# Patient Record
Sex: Female | Born: 1975 | Hispanic: No | Marital: Married | Smoking: Never smoker
Health system: Southern US, Community
[De-identification: ages and names within clinical notes are randomized; demographics above are authoritative.]

## PROBLEM LIST (undated history)

## (undated) DIAGNOSIS — G510 Bell's palsy: Secondary | ICD-10-CM

## (undated) DIAGNOSIS — O99350 Diseases of the nervous system complicating pregnancy, unspecified trimester: Secondary | ICD-10-CM

## (undated) DIAGNOSIS — IMO0002 Reserved for concepts with insufficient information to code with codable children: Secondary | ICD-10-CM

## (undated) DIAGNOSIS — F99 Mental disorder, not otherwise specified: Secondary | ICD-10-CM

## (undated) DIAGNOSIS — F410 Panic disorder [episodic paroxysmal anxiety] without agoraphobia: Secondary | ICD-10-CM

## (undated) DIAGNOSIS — Z789 Other specified health status: Secondary | ICD-10-CM

## (undated) DIAGNOSIS — K219 Gastro-esophageal reflux disease without esophagitis: Secondary | ICD-10-CM

## (undated) HISTORY — DX: Gastro-esophageal reflux disease without esophagitis: K21.9

## (undated) HISTORY — DX: Reserved for concepts with insufficient information to code with codable children: IMO0002

## (undated) HISTORY — PX: NO PAST SURGERIES: SHX2092

---

## 2004-07-10 LAB — CONVERTED CEMR LAB: Pap Smear: NORMAL

## 2007-05-22 ENCOUNTER — Ambulatory Visit: Payer: Self-pay | Admitting: Family Medicine

## 2007-05-22 DIAGNOSIS — K219 Gastro-esophageal reflux disease without esophagitis: Secondary | ICD-10-CM

## 2007-09-06 ENCOUNTER — Ambulatory Visit: Payer: Self-pay | Admitting: Family Medicine

## 2007-12-11 ENCOUNTER — Telehealth: Payer: Self-pay | Admitting: Family Medicine

## 2007-12-20 ENCOUNTER — Ambulatory Visit: Payer: Self-pay | Admitting: Family Medicine

## 2007-12-20 DIAGNOSIS — A048 Other specified bacterial intestinal infections: Secondary | ICD-10-CM | POA: Insufficient documentation

## 2007-12-20 DIAGNOSIS — Z8711 Personal history of peptic ulcer disease: Secondary | ICD-10-CM | POA: Insufficient documentation

## 2007-12-23 ENCOUNTER — Telehealth: Payer: Self-pay | Admitting: Family Medicine

## 2007-12-24 LAB — CONVERTED CEMR LAB
ALT: 14 units/L (ref 0–35)
AST: 20 units/L (ref 0–37)
Albumin: 4.3 g/dL (ref 3.5–5.2)
Alkaline Phosphatase: 49 units/L (ref 39–117)
BUN: 12 mg/dL (ref 6–23)
Basophils Absolute: 0 10*3/uL (ref 0.0–0.1)
Basophils Relative: 0 % (ref 0–1)
Bilirubin, Direct: 0.1 mg/dL (ref 0.0–0.3)
CO2: 23 meq/L (ref 19–32)
Calcium: 8.7 mg/dL (ref 8.4–10.5)
Chloride: 106 meq/L (ref 96–112)
Creatinine, Ser: 0.64 mg/dL (ref 0.40–1.20)
Eosinophils Absolute: 0.1 10*3/uL (ref 0.0–0.7)
Eosinophils Relative: 1 % (ref 0–5)
Glucose, Bld: 87 mg/dL (ref 70–99)
HCT: 35.8 % — ABNORMAL LOW (ref 36.0–46.0)
Hemoglobin: 12 g/dL (ref 12.0–15.0)
Indirect Bilirubin: 0.5 mg/dL (ref 0.0–0.9)
Lipase: 17 units/L (ref 0–75)
Lymphocytes Relative: 28 % (ref 12–46)
Lymphs Abs: 2.6 10*3/uL (ref 0.7–4.0)
MCHC: 33.5 g/dL (ref 30.0–36.0)
MCV: 76.5 fL — ABNORMAL LOW (ref 78.0–100.0)
Monocytes Absolute: 0.7 10*3/uL (ref 0.1–1.0)
Monocytes Relative: 8 % (ref 3–12)
Neutro Abs: 6 10*3/uL (ref 1.7–7.7)
Neutrophils Relative %: 64 % (ref 43–77)
Platelets: 287 10*3/uL (ref 150–400)
Potassium: 3.7 meq/L (ref 3.5–5.3)
RBC: 4.68 M/uL (ref 3.87–5.11)
RDW: 13.1 % (ref 11.5–15.5)
Sodium: 140 meq/L (ref 135–145)
Total Bilirubin: 0.6 mg/dL (ref 0.3–1.2)
Total Protein: 7 g/dL (ref 6.0–8.3)
WBC: 9.4 10*3/uL (ref 4.0–10.5)

## 2010-01-18 ENCOUNTER — Ambulatory Visit: Payer: Self-pay | Admitting: Family Medicine

## 2010-01-18 DIAGNOSIS — R51 Headache: Secondary | ICD-10-CM | POA: Insufficient documentation

## 2010-01-18 DIAGNOSIS — R519 Headache, unspecified: Secondary | ICD-10-CM | POA: Insufficient documentation

## 2010-05-10 LAB — CONVERTED CEMR LAB: Pap Smear: NORMAL

## 2010-07-20 ENCOUNTER — Telehealth (INDEPENDENT_AMBULATORY_CARE_PROVIDER_SITE_OTHER): Payer: Self-pay | Admitting: *Deleted

## 2010-07-22 ENCOUNTER — Other Ambulatory Visit: Payer: Self-pay | Admitting: Family Medicine

## 2010-07-22 ENCOUNTER — Ambulatory Visit
Admission: RE | Admit: 2010-07-22 | Discharge: 2010-07-22 | Payer: Self-pay | Source: Home / Self Care | Attending: Family Medicine | Admitting: Family Medicine

## 2010-07-22 LAB — HEPATIC FUNCTION PANEL
ALT: 16 U/L (ref 0–35)
AST: 21 U/L (ref 0–37)
Albumin: 4.2 g/dL (ref 3.5–5.2)
Alkaline Phosphatase: 45 U/L (ref 39–117)
Bilirubin, Direct: 0.1 mg/dL (ref 0.0–0.3)
Total Bilirubin: 0.8 mg/dL (ref 0.3–1.2)
Total Protein: 7.2 g/dL (ref 6.0–8.3)

## 2010-07-22 LAB — LDL CHOLESTEROL, DIRECT: Direct LDL: 139.5 mg/dL

## 2010-07-22 LAB — LIPID PANEL
Cholesterol: 207 mg/dL — ABNORMAL HIGH (ref 0–200)
HDL: 62.4 mg/dL (ref >39.00)
Total CHOL/HDL Ratio: 3
Triglycerides: 33 mg/dL (ref 0.0–149.0)
VLDL: 6.6 mg/dL (ref 0.0–40.0)

## 2010-07-22 LAB — BASIC METABOLIC PANEL
BUN: 13 mg/dL (ref 6–23)
CO2: 28 mEq/L (ref 19–32)
Calcium: 9 mg/dL (ref 8.4–10.5)
Chloride: 102 mEq/L (ref 96–112)
Creatinine, Ser: 0.6 mg/dL (ref 0.4–1.2)
GFR: 129.86 mL/min (ref >60.00)
Glucose, Bld: 91 mg/dL (ref 70–99)
Potassium: 3.7 mEq/L (ref 3.5–5.1)
Sodium: 136 mEq/L (ref 135–145)

## 2010-07-29 ENCOUNTER — Ambulatory Visit
Admission: RE | Admit: 2010-07-29 | Discharge: 2010-07-29 | Payer: Self-pay | Source: Home / Self Care | Attending: Family Medicine | Admitting: Family Medicine

## 2010-08-09 NOTE — Assessment & Plan Note (Signed)
Summary: HA X 1 WK/CLE   Vital Signs:  Patient profile:   35 year old female Height:      60.75 inches Weight:      109.4 pounds BMI:     20.92 Temp:     98.5 degrees F oral Pulse rate:   92 / minute Pulse rhythm:   regular BP sitting:   90 / 60  (left arm) Cuff size:   regular  Vitals Entered By: Benny Lennert CMA Duncan Dull) (January 18, 2010 3:31 PM)   History of Present Illness: Chief complaint headache for 1 week  In last week has had a headache..this is more severe than intermittant headhcaes in past. Associated nausea, some dizzy, irritated with light and sound. B temples and posterior neck and pain in upper shoulders. Describes as squeezing pain.  8/10 HAs been under a lot of stress lately. No numbness, no weakness Some fatigue. no vision changes... did recently have new eye glasses in past 6 months.  Using computer all the time.  Feels like heat triggered.   Has been using advil (400 mg at a time)...helps some but then returns.  Hx of migraine in past.   Interested in getting pregnant.Marland Kitchenafter next period.   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 1 Tab By Mouth Q 6 Hours As Needed Headache  Allergies (verified): No Known Drug Allergies  Past History:  Past medical, surgical, family and social histories (including risk factors) reviewed, and no changes noted (except as noted below).  Past Medical History: Reviewed history from 05/22/2007 and no changes required. GERD Peptic ulcer disease  Past Surgical History: Reviewed history from 05/22/2007 and no changes required. Endoscopy 2005: PUD Denies surgical history  Family History: Reviewed history from  05/22/2007 and no changes required. father age 41 healthy mother age 65 healthy 1 sister healthy gtreat uncle leukemia  Social History: Reviewed history from 05/22/2007 and no changes required. Occupation: Magazine features editor Married no abuse Never Smoked Alcohol use-no Drug use-no Regular exercise-yes, 2 x weekrunning, lift weight Diet: fruits and veggies, water  Review of Systems General:  Denies fatigue and fever. CV:  Denies chest pain or discomfort. Resp:  Denies shortness of breath. GI:  Denies abdominal pain. GU:  Denies dysuria.  Physical Exam  General:  Well-developed,well-nourished,in no acute distress; alert,appropriate and cooperative throughout examination Head:  no maxillary sinusttp  Eyes:  No corneal or conjunctival inflammation noted. EOMI. Perrla. Funduscopic exam benign, without hemorrhages, exudates or papilledema. Vision grossly normal. Ears:  External ear exam shows no significant lesions or deformities.  Otoscopic examination reveals clear canals, tympanic membranes are intact bilaterally without bulging, retraction, inflammation or discharge. Hearing is grossly normal bilaterally. Nose:  External nasal examination shows no deformity or inflammation. Nasal mucosa are pink and moist without lesions or exudates. Mouth:  Oral mucosa and oropharynx without lesions or exudates.  Teeth in good repair. Neck:  no carotid bruit or thyromegaly no cervical or supraclavicular lymphadenopathy  Lungs:  Normal respiratory effort, chest expands symmetrically. Lungs are clear to auscultation, no crackles or wheezes. Heart:  Normal rate and regular rhythm. S1 and S2 normal without gallop, murmur, click, rub or other extra sounds. Neurologic:  No cranial nerve deficits noted. Station and gait  are normal. DTRs are symmetrical throughout. Sensory, motor and coordinative functions appear intact.   Impression & Recommendations:  Problem # 1:  HEADACHE (ICD-784.0) Tension headhace vs  migriane. Gave info on headache diary anfd migraine trigger.  Work on eating regularly, pushing fluids, sleeping and exercising adequately. Refused torodol injection.  Fiorecet for headache, ibuprofen for moderate headhache.  Ice massage on neck.  Follow up if not improving.  Her updated medication list for this problem includes:    Fioricet 50-325-40 Mg Tabs (Butalbital-apap-caffeine) .Marland Kitchen... 1 tab by mouth q 6 hours as needed headache  Complete Medication List: 1)  Fioricet 50-325-40 Mg Tabs (Butalbital-apap-caffeine) .Marland Kitchen.. 1 tab by mouth q 6 hours as needed headache  Patient Instructions: 1)  USe fioricet for severe headaches. MAy use ibuprofen 800 mg every 3 hours as needed  for milder headhaces. 2)  Eat regularly. increase water. 3)   Decrease caffeine. 4)   Keep headache diary...look for any possible migraine trigger. 5)  Make a follow up appt if headaches are not improving. 6)  Schedule CPX in next few weeks. 7)  Use tylenol for headhaces whe trying to get pregnant or pregnant. 8)  Start prenatal vitamin with folic acid.Marland Kitchen 9)  Book "What to Expect When You Are Expecting" Prescriptions: FIORICET 50-325-40 MG TABS (BUTALBITAL-APAP-CAFFEINE) 1 tab by mouth q 6 hours as needed headache  #20 x 0   Entered and Authorized by:   Kerby Nora MD   Signed by:   Kerby Nora MD on 01/18/2010   Method used:   Print then Give to Patient   RxID:   [id number]   Current Allergies (reviewed today): No known allergies

## 2010-08-11 NOTE — Assessment & Plan Note (Signed)
Summary: CPX  CYD   Vital Signs:  Patient profile:   35 year old female Height:      60.75 inches Weight:      112.50 pounds BMI:     21.51 Temp:     97.8 degrees F oral Pulse rate:   88 / minute Pulse rhythm:   regular BP sitting:   100 / 70  (left arm) Cuff size:   regular  Vitals Entered By: Benny Lennert CMA Duncan Dull) (July 29, 2010 3:29 PM)  History of Present Illness: Chief complaint: Follow up   The patient is here for annual wellness exam and preventative care.    Sees GYN for CPX/ PAP and breast exam.   Tension headache (Vise around head and pain in neck.. mild, occuring every 2 days.. she feels it is due to stress.  Headaches improved when in [place] for 5 weeks.  Has stopped fioricet due to planning on getting pregnant.  Stretching head and neck. Skipping meals a lot.    Preventive Screening-Counseling & Management  Caffeine-Diet-Exercise     Diet Comments: healthy foods.     Does Patient Exercise: yes     Times/week: 3-4  Problems Prior to Update: 1)  Screening For Lipoid Disorders  (ICD-V77.91) 2)  Headache  (ICD-784.0) 3)  Hx of Peptic Ulcer Disease  (ICD-533.90) 4)  Hx of Helicobacter Pylori Gastritis  (ICD-041.86) 5)  Gerd  (ICD-530.81)  Current Medications (verified): 1)  Th Prenatal Vitamins 28-0.8 Mg Tabs (Prenatal Vit-Fe Fumarate-Fa) .... Take One Tablet Daily  Allergies (verified): No Known Drug Allergies  Past History:  Past medical, surgical, family and social histories (including risk factors) reviewed, and no changes noted (except as noted below).  Past Medical History: Reviewed history from 05/22/2007 and no changes required. GERD Peptic ulcer disease  Past Surgical History: Reviewed history from 05/22/2007 and no changes required. Endoscopy 2005: PUD Denies surgical history  Family History: Reviewed history from 05/22/2007 and no changes required. father age 50 healthy mother age 20 healthy 1 sister healthy gtreat  uncle leukemia  Social History: Reviewed history from 05/22/2007 and no changes required. Occupation: Magazine features editor Married no abuse Never Smoked Alcohol use-no Drug use-no Regular exercise-yes, 2 x weekrunning, lift weight Diet: fruits and veggies, water  Review of Systems General:  Denies fatigue and fever. CV:  Denies chest pain or discomfort. Resp:  Denies shortness of breath. GI:  Denies abdominal pain. GU:  Denies dysuria.  Physical Exam  General:  Well-developed,well-nourished,in no acute distress; alert,appropriate and cooperative throughout examination Head:  Normocephalic and atraumatic without obvious abnormalities. No apparent alopecia or balding. Eyes:  No corneal or conjunctival inflammation noted. EOMI. Perrla. Funduscopic exam benign, without hemorrhages, exudates or papilledema. Vision grossly normal. Ears:  External ear exam shows no significant lesions or deformities.  Otoscopic examination reveals clear canals, tympanic membranes are intact bilaterally without bulging, retraction, inflammation or discharge. Hearing is grossly normal bilaterally. Nose:  External nasal examination shows no deformity or inflammation. Nasal mucosa are pink and moist without lesions or exudates. Mouth:  Oral mucosa and oropharynx without lesions or exudates.  Teeth in good repair. Neck:  no carotid bruit or thyromegaly no cervical or supraclavicular lymphadenopathy  Breasts:  per GYN  Lungs:  Normal respiratory effort, chest expands symmetrically. Lungs are clear to auscultation, no crackles or wheezes. Heart:  Normal rate and regular rhythm. S1 and S2 normal without gallop, murmur, click, rub or other extra sounds. Abdomen:  Bowel sounds positive,abdomen soft and non-tender  without masses, organomegaly or hernias noted. Genitalia:  per GYN  Msk:  No deformity or scoliosis noted of thoracic or lumbar spine.   Pulses:  R and L carotid,radial,femoral,dorsalis pedis and posterior tibial  pulses are full and equal bilaterally Extremities:  No clubbing, cyanosis, edema, or deformity noted with normal full range of motion of all joints.   Neurologic:  No cranial nerve deficits noted. Station and gait are normal. Plantar reflexes are down-going bilaterally. DTRs are symmetrical throughout. Sensory, motor and coordinative functions appear intact. Skin:  Intact without suspicious lesions or rashes Psych:  Cognition and judgment appear intact. Alert and cooperative with normal attention span and concentration. No apparent delusions, illusions, hallucinations   Impression & Recommendations:  Problem # 1:  GERD (ICD-530.81) Assessment Improved Well controlled off medicaiton.   Problem # 2:  HEADACHE (ICD-784.0) Assessment: Improved Tension headhaces. Avoid sipping meals, increase exercsie, stress reduction. Head and neck strethcing. Nml neuro exam.  The following medications were removed from the medication list:    Fioricet 50-325-40 Mg Tabs (Butalbital-apap-caffeine) .Marland Kitchen... 1 tab by mouth q 6 hours as needed headache  Problem # 3:  Preventive Health Care (ICD-V70.0) Assessment: Comment Only The patient's preventative maintenance and recommended screening tests for an annual wellness exam were reviewed in full today. Brought up to date unless services declined.  Counselled on the importance of diet, exercise, and its role in overall health and mortality. The patient's FH and SH was reviewed, including their home life, tobacco status, and drug and alcohol status.    Sees GYN.   Complete Medication List: 1)  Th Prenatal Vitamins 28-0.8 Mg Tabs (Prenatal vit-fe fumarate-fa) .... Take one tablet daily  Patient Instructions: 1)  Work on three meals a day... try fat free yogurt or other healthy foods for breakfast. 2)  Continue regular exercise. 3)  Please schedule a follow-up appointment in 1 year.    Orders Added: 1)  Est. Patient Level IV [16109]    Current Allergies  (reviewed today): No known allergies   Flu Vaccine Result Date:  05/10/2009 Flu Vaccine Result:  given Flu Vaccine Next Due:  1 yr Last PAP:  Normal (07/10/2004 8:40:57 AM) PAP Result Date:  05/10/2010 PAP Result:  normal PAP Next Due:  1 yr

## 2010-08-11 NOTE — Progress Notes (Signed)
----   Converted from flag ---- ---- 07/19/2010 9:53 AM, Kerby Nora MD wrote: Offer STD testing if interested.  CMET, lipids Dx v77.91  ---- 07/18/2010 11:38 AM, Liane Comber CMA (AAMA) wrote: Lab orders please! Good Morning! This pt is scheduled for cpx labs Friday, which labs to draw and dx codes to use? Thanks Tasha ------------------------------

## 2010-10-17 LAB — HIV ANTIBODY (ROUTINE TESTING W REFLEX): HIV: NONREACTIVE

## 2010-10-17 LAB — ABO/RH: RH Type: POSITIVE

## 2011-03-11 ENCOUNTER — Inpatient Hospital Stay (HOSPITAL_COMMUNITY): Admission: AD | Admit: 2011-03-11 | Payer: Self-pay | Source: Ambulatory Visit | Admitting: Obstetrics & Gynecology

## 2011-05-12 ENCOUNTER — Inpatient Hospital Stay (HOSPITAL_COMMUNITY): Payer: BC Managed Care – PPO

## 2011-05-12 ENCOUNTER — Encounter (HOSPITAL_COMMUNITY): Payer: Self-pay | Admitting: *Deleted

## 2011-05-12 ENCOUNTER — Inpatient Hospital Stay (HOSPITAL_COMMUNITY)
Admission: AD | Admit: 2011-05-12 | Discharge: 2011-05-12 | Disposition: A | Discharge status: Home / Self Care | Payer: BC Managed Care – PPO | Source: Ambulatory Visit | Attending: Obstetrics | Admitting: Obstetrics

## 2011-05-12 DIAGNOSIS — O368190 Decreased fetal movements, unspecified trimester, not applicable or unspecified: Secondary | ICD-10-CM | POA: Insufficient documentation

## 2011-05-12 DIAGNOSIS — G510 Bell's palsy: Secondary | ICD-10-CM | POA: Insufficient documentation

## 2011-05-12 DIAGNOSIS — O99891 Other specified diseases and conditions complicating pregnancy: Secondary | ICD-10-CM | POA: Insufficient documentation

## 2011-05-12 LAB — GLUCOSE, CAPILLARY: Glucose-Capillary: 101 mg/dL — ABNORMAL HIGH (ref 70–99)

## 2011-05-12 NOTE — Progress Notes (Signed)
Dr Ernestina Penna states she is on the way in to see the pt

## 2011-05-12 NOTE — Progress Notes (Signed)
DENIES PAIN.  DENIES HSV AND MRSA.  FELT BABY MOVE- YESTERDAY- Thursday AM- FEELS NOTHING NOW- FHR- 158.  IS TAKING MED FOR BELL PALSY-  FACE IS DRAWN - WENT TO [facility]

## 2011-05-12 NOTE — H&P (Signed)
CC: decreased FM  HPI: 35 yo G1 at 22 + wks who presents for not feeling the baby move since 11 am yest. Pt notes not feeling FM since she started taking Valtrex ant prednisone (60mg  daily) yesterday. Pt went to [facility] on Mon due to concerns of "stroke sx"Pt was having new onset facial paralysis and was dx w/ Bells Palsy. Pt saw Dr. Seymour Bars yest, discussed meds and was started them yest. Pt does not want to cont meds.  PMH: none PSH: none Meds: PNV, prednisone, Valtrex All: NKDA  PE:   Filed Vitals:   05/12/11 0325 05/12/11 0531  BP: 120/81 125/70  Pulse: 108 106  Temp: 98.1 F (36.7 C)   TempSrc: Oral   Resp: 20 20  Height: 5' (1.524 m)   Weight: 72.179 kg (159 lb 2 oz)     Gen: lying in bed, no distress, facial paralysis on L Neuro:unable to feel lower L side of face, unable to close L eye, blurred speech, unable to move L side of mouth ABd: gravid, NT GU: per RN cvx closed LE: no edema  Toco: ctx/ irritibitlity q 5-8 FH: 130's, + accels, no decels.  U/s: grossly nl AFI, largest pocket >4cm, BPP 8/8  A/P: 38 wks IUP w/ Bellsy plasy and decreased fetal movement in the setting of reacitve testing - FM. Reasurred pt that baby is doing well and the reactive testign today gives [place] good reasurrance for the next 3 days. If, in that time, not feeling the baby (kick counts reviewed) will need another eval by NST. If new sx (bleeding, abd pain) develop and not feeling FM, will also need another eval - Bells Palsy. Pt would like to stop meds. OK to stop as not clear they will help resolve the Bells and if this is causing pt to feel decreased FM we would rather pt be reasurred about the baby. Will consider meds PP. Importance of proper eye care reviewed. - Reactive fetal testing - f/u in office next wk - Not in labor  Felicia Mccoy A. 05/12/2011 6:23 AM

## 2011-05-13 ENCOUNTER — Encounter (HOSPITAL_COMMUNITY)
Admission: AD | Disposition: A | Discharge status: Home / Self Care | Payer: Self-pay | Source: Ambulatory Visit | Attending: Obstetrics & Gynecology

## 2011-05-13 ENCOUNTER — Other Ambulatory Visit: Payer: Self-pay | Admitting: Obstetrics & Gynecology

## 2011-05-13 ENCOUNTER — Inpatient Hospital Stay (HOSPITAL_COMMUNITY): Payer: BC Managed Care – PPO | Admitting: Anesthesiology

## 2011-05-13 ENCOUNTER — Inpatient Hospital Stay (HOSPITAL_COMMUNITY)
Admission: AD | Admit: 2011-05-13 | Discharge: 2011-05-16 | DRG: 371 | Disposition: A | Discharge status: Home / Self Care | Payer: BC Managed Care – PPO | Source: Ambulatory Visit | Attending: Obstetrics & Gynecology | Admitting: Obstetrics & Gynecology

## 2011-05-13 ENCOUNTER — Encounter (HOSPITAL_COMMUNITY): Payer: Self-pay | Admitting: *Deleted

## 2011-05-13 ENCOUNTER — Encounter (HOSPITAL_COMMUNITY): Payer: Self-pay | Admitting: Anesthesiology

## 2011-05-13 DIAGNOSIS — R51 Headache: Secondary | ICD-10-CM

## 2011-05-13 DIAGNOSIS — K219 Gastro-esophageal reflux disease without esophagitis: Secondary | ICD-10-CM

## 2011-05-13 DIAGNOSIS — O99892 Other specified diseases and conditions complicating childbirth: Secondary | ICD-10-CM | POA: Diagnosis present

## 2011-05-13 DIAGNOSIS — IMO0002 Reserved for concepts with insufficient information to code with codable children: Secondary | ICD-10-CM

## 2011-05-13 DIAGNOSIS — A048 Other specified bacterial intestinal infections: Secondary | ICD-10-CM

## 2011-05-13 DIAGNOSIS — G510 Bell's palsy: Secondary | ICD-10-CM | POA: Diagnosis present

## 2011-05-13 DIAGNOSIS — K279 Peptic ulcer, site unspecified, unspecified as acute or chronic, without hemorrhage or perforation: Secondary | ICD-10-CM

## 2011-05-13 DIAGNOSIS — O411090 Infection of amniotic sac and membranes, unspecified, unspecified trimester, not applicable or unspecified: Secondary | ICD-10-CM | POA: Diagnosis present

## 2011-05-13 HISTORY — DX: Diseases of the nervous system complicating pregnancy, unspecified trimester: O99.350

## 2011-05-13 HISTORY — DX: Panic disorder (episodic paroxysmal anxiety): F41.0

## 2011-05-13 HISTORY — DX: Other specified health status: Z78.9

## 2011-05-13 HISTORY — DX: Bell's palsy: G51.0

## 2011-05-13 HISTORY — DX: Mental disorder, not otherwise specified: F99

## 2011-05-13 LAB — CBC
HCT: 41.2 % (ref 36.0–46.0)
Hemoglobin: 11.6 g/dL — ABNORMAL LOW (ref 12.0–15.0)
Platelets: 284 10*3/uL (ref 150–400)
Platelets: 245 10*3/uL (ref 150–400)
RBC: 4.31 MIL/uL (ref 3.87–5.11)
RDW: 13.8 % (ref 11.5–15.5)
WBC: 14.7 10*3/uL — ABNORMAL HIGH (ref 4.0–10.5)
WBC: 19.6 10*3/uL — ABNORMAL HIGH (ref 4.0–10.5)

## 2011-05-13 LAB — POCT FERN TEST: Fern Test: POSITIVE

## 2011-05-13 SURGERY — Surgical Case
Anesthesia: Regional

## 2011-05-13 MED ORDER — EPHEDRINE 5 MG/ML INJ
10.0000 mg | INTRAVENOUS | Status: DC | PRN
  Filled 2011-05-13 (×2): qty 4

## 2011-05-13 MED ORDER — OXYTOCIN 20 UNITS IN LACTATED RINGERS INFUSION - SIMPLE
125.0000 mL/h | Freq: Once | INTRAVENOUS | Status: DC
  Filled 2011-05-13: qty 1000

## 2011-05-13 MED ORDER — LACTATED RINGERS IV SOLN
500.0000 mL | Freq: Once | INTRAVENOUS | Status: AC
  Administered 2011-05-13: 500 mL via INTRAVENOUS

## 2011-05-13 MED ORDER — DIPHENHYDRAMINE HCL 50 MG/ML IJ SOLN: 12.5000 mg | INTRAMUSCULAR | Status: DC | PRN

## 2011-05-13 MED ORDER — CITRIC ACID-SODIUM CITRATE 334-500 MG/5ML PO SOLN
30.0000 mL | ORAL | Status: DC | PRN
  Administered 2011-05-13 (×2): 30 mL via ORAL
  Filled 2011-05-13 (×2): qty 15

## 2011-05-13 MED ORDER — IBUPROFEN 600 MG PO TABS: 600.0000 mg | ORAL_TABLET | Freq: Four times a day (QID) | ORAL | Status: DC | PRN

## 2011-05-13 MED ORDER — FENTANYL 2.5 MCG/ML BUPIVACAINE 1/10 % EPIDURAL INFUSION (WH - ANES)
14.0000 mL/h | INTRAMUSCULAR | Status: DC
Start: 2011-05-13 — End: 2011-05-13
  Administered 2011-05-13: 14 mL/h via EPIDURAL
  Administered 2011-05-13: 13 mL/h via EPIDURAL
  Administered 2011-05-13: 14 mL/h via EPIDURAL
  Filled 2011-05-13 (×3): qty 60

## 2011-05-13 MED ORDER — SCOPOLAMINE 1 MG/3DAYS TD PT72
MEDICATED_PATCH | TRANSDERMAL | Status: AC
  Filled 2011-05-13: qty 1

## 2011-05-13 MED ORDER — KETOROLAC TROMETHAMINE 30 MG/ML IJ SOLN
15.0000 mg | Freq: Once | INTRAMUSCULAR | Status: AC | PRN
  Administered 2011-05-13: 30 mg via INTRAVENOUS

## 2011-05-13 MED ORDER — ACETAMINOPHEN 325 MG PO TABS: 325.0000 mg | ORAL_TABLET | ORAL | Status: DC | PRN

## 2011-05-13 MED ORDER — OXYTOCIN 10 UNIT/ML IJ SOLN
INTRAMUSCULAR | Status: AC
  Filled 2011-05-13: qty 4

## 2011-05-13 MED ORDER — FLEET ENEMA 7-19 GM/118ML RE ENEM: 1.0000 | ENEMA | RECTAL | Status: DC | PRN

## 2011-05-13 MED ORDER — MORPHINE SULFATE 0.5 MG/ML IJ SOLN
INTRAMUSCULAR | Status: AC
  Filled 2011-05-13: qty 10

## 2011-05-13 MED ORDER — OXYCODONE-ACETAMINOPHEN 5-325 MG PO TABS
2.0000 | ORAL_TABLET | ORAL | Status: DC | PRN
  Filled 2011-05-13: qty 2

## 2011-05-13 MED ORDER — PHENYLEPHRINE 40 MCG/ML (10ML) SYRINGE FOR IV PUSH (FOR BLOOD PRESSURE SUPPORT)
80.0000 ug | PREFILLED_SYRINGE | INTRAVENOUS | Status: DC | PRN
  Filled 2011-05-13: qty 5

## 2011-05-13 MED ORDER — OXYTOCIN 20 UNITS IN LACTATED RINGERS INFUSION - SIMPLE
INTRAVENOUS | Status: DC | PRN
  Administered 2011-05-13 (×2): 20 [IU] via INTRAVENOUS

## 2011-05-13 MED ORDER — SODIUM BICARBONATE 8.4 % IV SOLN
INTRAVENOUS | Status: AC
  Filled 2011-05-13: qty 50

## 2011-05-13 MED ORDER — LIDOCAINE HCL 1.5 % IJ SOLN
INTRAMUSCULAR | Status: DC | PRN
  Administered 2011-05-13 (×4): 5 mL via EPIDURAL

## 2011-05-13 MED ORDER — CEFOXITIN SODIUM 1 G IV SOLR
1.0000 g | Freq: Four times a day (QID) | INTRAVENOUS | Status: DC
  Administered 2011-05-13 – 2011-05-14 (×5): 1 g via INTRAVENOUS
  Filled 2011-05-13 (×6): qty 1

## 2011-05-13 MED ORDER — LIDOCAINE HCL (PF) 1 % IJ SOLN: 30.0000 mL | INTRAMUSCULAR | Status: DC | PRN

## 2011-05-13 MED ORDER — MEPERIDINE HCL 25 MG/ML IJ SOLN
6.2500 mg | INTRAMUSCULAR | Status: DC | PRN
  Administered 2011-05-13: 6.25 mg via INTRAVENOUS

## 2011-05-13 MED ORDER — MORPHINE SULFATE (PF) 0.5 MG/ML IJ SOLN
INTRAMUSCULAR | Status: DC | PRN
  Administered 2011-05-13: 4 mg via EPIDURAL
  Administered 2011-05-13: 1 mg via INTRAVENOUS

## 2011-05-13 MED ORDER — ONDANSETRON HCL 4 MG/2ML IJ SOLN: 4.0000 mg | Freq: Four times a day (QID) | INTRAMUSCULAR | Status: DC | PRN

## 2011-05-13 MED ORDER — OXYTOCIN BOLUS FROM INFUSION
500.0000 mL | Freq: Once | INTRAVENOUS | Status: DC
  Filled 2011-05-13: qty 500

## 2011-05-13 MED ORDER — ACETAMINOPHEN 500 MG PO TABS
1000.0000 mg | ORAL_TABLET | Freq: Four times a day (QID) | ORAL | Status: AC | PRN
  Administered 2011-05-13: 1000 mg via ORAL
  Filled 2011-05-13: qty 2

## 2011-05-13 MED ORDER — LACTATED RINGERS IV SOLN
500.0000 mL | INTRAVENOUS | Status: DC | PRN
  Administered 2011-05-13 (×2): 500 mL via INTRAVENOUS

## 2011-05-13 MED ORDER — LACTATED RINGERS IV SOLN
INTRAVENOUS | Status: DC | PRN
  Administered 2011-05-13 (×3): via INTRAVENOUS

## 2011-05-13 MED ORDER — ACETAMINOPHEN 325 MG PO TABS: 650.0000 mg | ORAL_TABLET | ORAL | Status: DC | PRN

## 2011-05-13 MED ORDER — BUTORPHANOL TARTRATE 2 MG/ML IJ SOLN
1.0000 mg | INTRAMUSCULAR | Status: DC | PRN
  Administered 2011-05-13: 1 mg via INTRAVENOUS
  Filled 2011-05-13: qty 1

## 2011-05-13 MED ORDER — KETOROLAC TROMETHAMINE 30 MG/ML IJ SOLN
INTRAMUSCULAR | Status: AC
  Filled 2011-05-13: qty 1

## 2011-05-13 MED ORDER — MEPERIDINE HCL 25 MG/ML IJ SOLN
INTRAMUSCULAR | Status: AC
  Filled 2011-05-13: qty 1

## 2011-05-13 MED ORDER — OXYTOCIN 20 UNITS IN LACTATED RINGERS INFUSION - SIMPLE
1.0000 m[IU]/min | INTRAVENOUS | Status: DC
  Administered 2011-05-13: 2 m[IU]/min via INTRAVENOUS
  Administered 2011-05-13: 6 m[IU]/min via INTRAVENOUS
  Administered 2011-05-13: 4 m[IU]/min via INTRAVENOUS
  Administered 2011-05-13: 2 m[IU]/min via INTRAVENOUS
  Administered 2011-05-13: 8 m[IU]/min via INTRAVENOUS
  Administered 2011-05-13: 3 m[IU]/min via INTRAVENOUS

## 2011-05-13 MED ORDER — SODIUM BICARBONATE 8.4 % IV SOLN
INTRAVENOUS | Status: DC | PRN
  Administered 2011-05-13: 5 mL via EPIDURAL

## 2011-05-13 MED ORDER — TERBUTALINE SULFATE 1 MG/ML IJ SOLN: 0.2500 mg | Freq: Once | INTRAMUSCULAR | Status: DC | PRN

## 2011-05-13 MED ORDER — LACTATED RINGERS IV SOLN
INTRAVENOUS | Status: DC
  Administered 2011-05-13 (×2): 125 mL/h via INTRAVENOUS
  Administered 2011-05-13: 05:00:00 via INTRAVENOUS

## 2011-05-13 MED ORDER — EPHEDRINE 5 MG/ML INJ: 10.0000 mg | INTRAVENOUS | Status: DC | PRN

## 2011-05-13 MED ORDER — LIDOCAINE-EPINEPHRINE (PF) 2 %-1:200000 IJ SOLN
INTRAMUSCULAR | Status: AC
  Filled 2011-05-13: qty 20

## 2011-05-13 MED ORDER — SCOPOLAMINE 1 MG/3DAYS TD PT72
1.0000 | MEDICATED_PATCH | Freq: Once | TRANSDERMAL | Status: DC
  Administered 2011-05-13: 1.5 mg via TRANSDERMAL

## 2011-05-13 MED ORDER — ONDANSETRON HCL 4 MG/2ML IJ SOLN
INTRAMUSCULAR | Status: AC
  Filled 2011-05-13: qty 2

## 2011-05-13 MED ORDER — ONDANSETRON HCL 4 MG/2ML IJ SOLN
INTRAMUSCULAR | Status: DC | PRN
  Administered 2011-05-13: 4 mg via INTRAVENOUS

## 2011-05-13 MED ORDER — PROMETHAZINE HCL 25 MG/ML IJ SOLN: 6.2500 mg | INTRAMUSCULAR | Status: DC | PRN

## 2011-05-13 SURGICAL SUPPLY — 39 items
BENZOIN TINCTURE PRP APPL 2/3 (GAUZE/BANDAGES/DRESSINGS) IMPLANT
CHLORAPREP W/TINT 26ML (MISCELLANEOUS) ×2 IMPLANT
CLOTH BEACON ORANGE TIMEOUT ST (SAFETY) ×2 IMPLANT
CONTAINER PREFILL 10% NBF 15ML (MISCELLANEOUS) IMPLANT
DRESSING TELFA 8X3 (GAUZE/BANDAGES/DRESSINGS) ×2 IMPLANT
DRSG COVADERM 4X10 (GAUZE/BANDAGES/DRESSINGS) ×2 IMPLANT
ELECT REM PT RETURN 9FT ADLT (ELECTROSURGICAL) ×2
ELECTRODE REM PT RTRN 9FT ADLT (ELECTROSURGICAL) ×1 IMPLANT
EXTRACTOR VACUUM KIWI (MISCELLANEOUS) IMPLANT
EXTRACTOR VACUUM M CUP 4 TUBE (SUCTIONS) IMPLANT
GAUZE SPONGE 4X4 12PLY STRL LF (GAUZE/BANDAGES/DRESSINGS) ×4 IMPLANT
GLOVE BIO SURGEON STRL SZ7 (GLOVE) ×2 IMPLANT
GLOVE BIOGEL PI IND STRL 7.0 (GLOVE) ×2 IMPLANT
GLOVE BIOGEL PI INDICATOR 7.0 (GLOVE) ×2
GLOVE SURG SS PI 7.5 STRL IVOR (GLOVE) ×4 IMPLANT
GOWN PREVENTION PLUS LG XLONG (DISPOSABLE) ×6 IMPLANT
KIT ABG SYR 3ML LUER SLIP (SYRINGE) ×2 IMPLANT
NEEDLE HYPO 25X5/8 SAFETYGLIDE (NEEDLE) ×2 IMPLANT
NS IRRIG 1000ML POUR BTL (IV SOLUTION) ×2 IMPLANT
PACK C SECTION WH (CUSTOM PROCEDURE TRAY) ×2 IMPLANT
PAD ABD 7.5X8 STRL (GAUZE/BANDAGES/DRESSINGS) ×2 IMPLANT
RTRCTR C-SECT PINK 25CM LRG (MISCELLANEOUS) ×2 IMPLANT
SLEEVE SCD COMPRESS KNEE MED (MISCELLANEOUS) IMPLANT
STAPLER VISISTAT 35W (STAPLE) ×2 IMPLANT
STRIP CLOSURE SKIN 1/4X4 (GAUZE/BANDAGES/DRESSINGS) IMPLANT
SUT MNCRL 0 VIOLET CTX 36 (SUTURE) ×3 IMPLANT
SUT MONOCRYL 0 CTX 36 (SUTURE) ×3
SUT PLAIN 0 NONE (SUTURE) IMPLANT
SUT PLAIN 2 0 (SUTURE)
SUT PLAIN ABS 2-0 CT1 27XMFL (SUTURE) IMPLANT
SUT VIC AB 0 CT1 27 (SUTURE) ×2
SUT VIC AB 0 CT1 27XBRD ANBCTR (SUTURE) ×2 IMPLANT
SUT VIC AB 2-0 CT1 27 (SUTURE) ×2
SUT VIC AB 2-0 CT1 TAPERPNT 27 (SUTURE) ×2 IMPLANT
SUT VIC AB 4-0 KS 27 (SUTURE) IMPLANT
SUT VICRYL 0 TIES 12 18 (SUTURE) IMPLANT
TOWEL OR 17X24 6PK STRL BLUE (TOWEL DISPOSABLE) ×4 IMPLANT
TRAY FOLEY CATH 14FR (SET/KITS/TRAYS/PACK) IMPLANT
WATER STERILE IRR 1000ML POUR (IV SOLUTION) ×2 IMPLANT

## 2011-05-13 NOTE — Progress Notes (Signed)
Pt sitting at side of bed.  Pt states she "is not fine" "feels like chest is constricted" pulse ox above 90 percent Husband with pt

## 2011-05-13 NOTE — Progress Notes (Signed)
Pt states, " I woke up at 1:45 am and my panties felt wet, and I couldn't hold it. It keeps coming. Contractions started even before midnight."

## 2011-05-13 NOTE — Progress Notes (Signed)
Bernita Buffy CNM in. Spec exam done to r/o srom. Fern slides obtained.

## 2011-05-13 NOTE — Progress Notes (Signed)
Felicia Mccoy is a 35 y.o. G1P0 at [redacted]w[redacted]d. SROM since 2 am. Now maternal fever 100.3, tachycardia at 120-140s, warm to touch. No SOB?CP. Anxious abt poor progress of labor.   Objective: BP 107/62  Pulse 144  Temp(Src) 100.3 F (37.9 C) (Oral)  Resp 20  Ht 5\' 2"  (1.575 m)  Wt 71.215 kg (157 lb)  BMI 28.72 kg/m2  SpO2 100%   Total I/O In: -  Out: 550 [Urine:550]  FHT: 150s/ + accels/ no decels/ moderate variability. Category I.  UC:  q 3 min, 170-175 MV units with IUPC.  SVE:   Dilation: 9 Effacement (%): 80 Station: -2 Exam by:: Felicia Mccoy No change in exam since 5.20 pm despite rotation, pitocin.   Assessment / Plan:  Arrest of dilatation at 9 cm for over 2.1/2 hrs. Maternal fever, likely chorioamnionitis, start Cefoxitin 1 gm q 6 hrs and will cont for 48 hrs since most likely will need c-section.  Tylenol 1 gm PO stat. Pt very anxious with the idea of needing c-section. Reassess SVE again.   Felicia Mccoy R 05/13/2011, 7:59 PM

## 2011-05-13 NOTE — Transfer of Care (Signed)
Immediate Anesthesia Transfer of Care Note  Patient: Felicia Mccoy  Procedure(s) Performed:  CESAREAN SECTION  Patient Location: PACU  Anesthesia Type: Epidural  Level of Consciousness: awake, alert , oriented and patient cooperative  Airway & Oxygen Therapy: Patient Spontanous Breathing and Patient connected to nasal cannula oxygen  Post-op Assessment: Report given to PACU RN and Post -op Vital signs reviewed and stable  Post vital signs: Reviewed and stable  Complications: No apparent anesthesia complications

## 2011-05-13 NOTE — H&P (Signed)
Felicia Mccoy is a 35 y.o. female presenting at 38+ wks for ruptured membranes.  Uncomplicated Ob course, PNC at [facility] with Dr Felicia Mccoy. Partial previa, resolved at 30 wks, recent Bell's Palsy, o.w healthy female with no pregnancy issues.   History OB History    Grav Para Term Preterm Abortions TAB SAB Ect Mult Living   1              Past Medical History  Diagnosis Date  . Bell's palsy complicating pregnancy     L facial weakness. Present for a week   Past Surgical History  Procedure Date  . No past surgeries    Family History: family history is not on file. Social History:  reports that she has never smoked. She does not have any smokeless tobacco history on file. She reports that she does not drink alcohol or use illicit drugs.  ROS leaking+, no UCs/ bleeding. No HA/fever/SOB/CP/ LE swelling etc.   Dilation: Fingertip Effacement (%): 70 Exam by:: Felicia Mccoy CNM Blood pressure 116/78, temperature 98.6 F (37 C), temperature source Oral, resp. rate 20, height 5' (1.524 m), weight 71.215 kg (157 lb). Exam Physical Exam  Physical exam:  A&O x 3, no acute distress. Pleasant HEENT neg, no thyromegaly Lungs CTA bilat CV RRR, S1S2 normal Abdo soft, non tender, non acute Extr no edema/ tenderness Pelvic as above  FHT  140s/ + accels/ no decels/ moderate variability- Category I Toco none to irreg/occasional.   Prenatal labs: ABO, Rh: O/Positive/-- (04/09 0000) Antibody:  negative Rubella: Immune (04/09 0000) RPR: Nonreactive (04/09 0000)  HBsAg: Negative (04/09 0000)  HIV: Non-reactive (04/09 0000)  GBS: Negative (10/11 0000)  1 hr Glucola nl (115) Anatomy sono - nl, partial previa resolved at 30 wk f/up  Assessment/Plan: G1 at 38.3 wks, SROM about 4 hrs, no contractions, GBS neg. Vtx per RN. ROM confirmed. Pitocin per protocol, will try cervical balloon if needed.    Felicia Mccoy R 05/13/2011, 4:04 AM

## 2011-05-13 NOTE — Progress Notes (Signed)
Active labor now at 3-4 cm, awaiting epidural. Hx panic attacks, not doing well with contraction. Hold pitocin until epidural gets her comfortable. FHT 140s/ category I.

## 2011-05-13 NOTE — Anesthesia Preprocedure Evaluation (Addendum)
Anesthesia Evaluation  Patient identified by MRN, date of birth, ID band Patient awake    Reviewed: Allergy & Precautions, H&P , Patient's Chart, lab work & pertinent test results  Airway Mallampati: II TM Distance: >3 FB Neck ROM: full    Dental No notable dental hx.    Pulmonary neg pulmonary ROS,  clear to auscultation  Pulmonary exam normal       Cardiovascular neg cardio ROS regular Normal    Neuro/Psych  Headaches, PSYCHIATRIC DISORDERS Negative Neurological ROS  Negative Psych ROS   GI/Hepatic negative GI ROS, Neg liver ROS, PUD, GERD-  ,  Endo/Other  Negative Endocrine ROS  Renal/GU negative Renal ROS     Musculoskeletal   Abdominal   Peds  Hematology negative hematology ROS (+)   Anesthesia Other Findings Panic attacks  Reproductive/Obstetrics (+) Pregnancy                           Anesthesia Physical Anesthesia Plan  ASA: III and Emergent  Anesthesia Plan: Epidural   Post-op Pain Management:    Induction:   Airway Management Planned:   Additional Equipment:   Intra-op Plan:   Post-operative Plan:   Informed Consent: I have reviewed the patients History and Physical, chart, labs and discussed the procedure including the risks, benefits and alternatives for the proposed anesthesia with the patient or authorized representative who has indicated his/her understanding and acceptance.     Plan Discussed with:   Anesthesia Plan Comments:        Anesthesia Quick Evaluation

## 2011-05-13 NOTE — Anesthesia Procedure Notes (Addendum)
Epidural Patient location during procedure: OB Start time: 05/13/2011 1:18 PM  Staffing Performed by: anesthesiologist   Preanesthetic Checklist Completed: patient identified, site marked, surgical consent, pre-op evaluation, timeout performed, IV checked, risks and benefits discussed and monitors and equipment checked  Epidural Patient position: sitting Prep: site prepped and draped and DuraPrep Patient monitoring: continuous pulse ox and blood pressure Approach: midline Injection technique: LOR air and LOR saline  Needle:  Needle type: Tuohy  Needle gauge: 17 G Needle length: 9 cm Needle insertion depth: 6 cm Catheter type: closed end flexible Catheter size: 19 Gauge Catheter at skin depth: 11 cm Test dose: negative  Assessment Events: blood not aspirated, injection not painful, no injection resistance, negative IV test and no paresthesia  Additional Notes Patient identified.  Risk benefits discussed including failed block, incomplete pain control, headache, nerve damage, paralysis, blood pressure changes, nausea, vomiting, reactions to medication both toxic or allergic, and postpartum back pain.  Patient expressed understanding and wished to proceed.  All questions were answered.  Sterile technique used throughout procedure and epidural site dressed with sterile barrier dressing. No paresthesia or other complications noted.The patient did not experience any signs of intravascular injection such as tinnitus or metallic taste in mouth nor signs of intrathecal spread such as rapid motor block. Please see nursing notes for vital signs.   First epidural did not ever work, so redone at 1 level above.  All parameters the same except level placed at L2-L3.Marland Kitchen

## 2011-05-13 NOTE — Progress Notes (Signed)
IN OR 

## 2011-05-13 NOTE — Progress Notes (Signed)
Test dose.

## 2011-05-13 NOTE — Progress Notes (Signed)
Dr Juliene Pina notified of variables, lates.  Dr ordered to stop Pitocin.  MD unable to come to computer as MD is in an active case in OR

## 2011-05-13 NOTE — Progress Notes (Signed)
G1 at 38.4wks. Awoke and felt that panties were wet. When stood, "everything came out". Some fld ran down legs. Cont. To leak small amt fld. Scant amt blood noted on tissue when wipes

## 2011-05-13 NOTE — Progress Notes (Signed)
Pt moved to rocking chair husband sitting in front of pt

## 2011-05-13 NOTE — Progress Notes (Signed)
Pt not behaving as anxious in between uc's.  Pt still crying, moaning and verbalizing durring uc's  Unable to trace uc pattern due to maternal movement

## 2011-05-13 NOTE — Progress Notes (Signed)
  S: Feeling anxious - upset since IV stadol      Fearful for epidural - fears numb sensation / spouse concerned about epidural causing severe        panic attack      Not tolerating contractions well - moaning and screaming with ctx  O:  VS: Blood pressure 135/80, pulse 116, temperature 98.5 F (36.9 C), temperature source Oral, resp. rate 20, height 5\' 2"  (1.575 m), weight 71.215 kg (157 lb), SpO2 100.00%.        Moaning and crying out loud with ctx / lethargic between ctx / anxious with ctx - holding onto         spouse - will not let him out of her reach        FHR : baseline 145 / variability moderate / accels none / decels none        EFM: Category 1        Toco: contractions every 3-4 minutes / moderate         Cervical exam by Felicia Mccoy SCNM : 3/ 80% / vtx / 0        IUPC placed without difficulty - moderate yellow MSF        Membranes: Ruptured w/meconium   A: Active labor  P: Discussed with patient and spouse - consulting anesthesia for options: considering PCA fentanyl or epidural that would control pain without such a dense block to reduce her sensation of being so numb. Epidural would offer best pain control - allow her to be more alert for birth. Reviewed anticipatory guidance for active labor - ctx and going to become more painful in next few hours - recommend considering options now.  TC Dr Brayton Caves to talk with pt and spouse about epidural to reduce her anxiety about the procedure and answer questions.   Update Dr mody with status     Felicia Mccoy 05/13/2011, 12:23 PM

## 2011-05-13 NOTE — Progress Notes (Signed)
To BS via w/c °

## 2011-05-13 NOTE — Preoperative (Signed)
Beta Blockers   Reason not to administer Beta Blockers:Not Applicable 

## 2011-05-13 NOTE — Progress Notes (Signed)
Felicia Mccoy is a 35 y.o. G1P0 at [redacted]w[redacted]d here for AOL following SROM.  Subjective: Doing well since 2nd epidural placed. No rectal/pelvic pressure.   Objective: BP 112/76  Pulse 127  Temp(Src) 98.5 F (36.9 C) (Oral)  Resp 20  Ht 5\' 2"  (1.575 m)  Wt 71.215 kg (157 lb)  BMI 28.72 kg/m2  SpO2 100% Pitocin turned off due to few late decels and now resolved with spontaneous labor.  FHT: 150s/ + accels/ no decels/ moderate variability - Category I.  UC: spontaneous, q 3 min.  SVE:   Dilation: 9 Effacement (%): 80 Station: -1 Exam by:: Dr Juliene Pina Very asynclitic, back to pt's right.  Thin mec noted. IUPC in place.   Assessment / Plan: Rotate patient in bed to assist fetal rotation since baby in same position since she was first examined, ROT. Put in exaggerated Sim on left. FHT - recovered from category II after stopping pitocin, fluid bolus IV and O2 mask. Cont to asses progress.  Shea Evans R 05/13/2011, 5:39 PM

## 2011-05-13 NOTE — Progress Notes (Signed)
Felicia Mccoy is a 35 y.o. G1P0 at [redacted]w[redacted]d admitted for SROM. On Pitocin now and getting uncomfortable w pain. No other complaints.   Objective: BP 115/88  Pulse 114  Temp(Src) 97.9 F (36.6 C) (Oral)  Resp 18  Ht 5\' 2"  (1.575 m)  Wt 71.215 kg (157 lb)  BMI 28.72 kg/m2 Afebrile, in pain but no other distress.  FHT:  FHR: 140 bpm, variability: moderate,  accelerations:  Present,  decelerations:  Absent UC:   regular, every 3-4 minutes SVE:  1-2 cm/ 80%-3/ fore bag AROM digitally, thin meconium in AF/ Vtx, ballotable.   Labs: Lab Results  Component Value Date   WBC 14.7* 05/13/2011   HGB 14.5 05/13/2011   HCT 41.2 05/13/2011   MCV 79.1 05/13/2011   PLT 284 05/13/2011    Assessment / Plan:  38.3 wk, mec in AF (light), FHT category I, GBS neg. COnt pitocin AOL as tolerated. Stadol for now and Epidural after 3-4 cm and when station lower (than where is it now). Ambulate, sit up, birthing ball.   Felicia Mccoy 05/13/2011, 9:55 AM

## 2011-05-13 NOTE — Op Note (Signed)
Cesarean Section Procedure Note  Felicia Mccoy  On 05/13/2011 Pre-operative Diagnosis: 38.3 wks. Arrest of dilatation at 9 cm for over 3 hrs                                              Chorioamnionitis  Post-operative Diagnosis: same   Surgeon: Surgeon(s) and Role: Robley Fries - Primary  Assistants: Marlinda Mike, CNM Anesthesia: Epidural    Procedure Details:  The patient was evaluated in Labor room and arrest of dilatation was noted at 9 cm for over 3 hrs with adequate contractions. Fetal head descent was not present either. Cesarean delivery was advised. Risks, benefits, complications, treatment options, and expected outcomes were discussed with the patient. The patient concurred with the proposed plan, giving informed consent. identified as [name] and the procedure verified as C-Section Delivery. She received 1 gm Cefoxitin IV in labor room for chorioamnionitis and will continue for 8 total doses. She was brought to the operating room with IV, foley in place.  A Time Out was held and the above information confirmed.  After induction of anesthesia, the patient was prepped and draped in the usual sterile manner. A Pfannenstiel incision was made and carried down through the subcutaneous tissue to the fascia. Fascial incision was made and extended transversely. The fascia was separated from the underlying rectus tissue superiorly and inferiorly. The peritoneum was identified and entered. Peritoneal incision was extended longitudinally. The utero-vesical peritoneal reflection was incised transversely and the bladder flap was bluntly freed from the lower uterine segment. A low transverse uterine incision was made. Delivered from cephalic OT position was a Baby BOY at 21.26 hrs with Apgar scores of 9 ad 9 at 1 and 5 min. Cord was clamped and cut, baby handed to NICU team after mouth bulb suction.ing.  Cord ph was 7.31 (arterial). Cord blood sent as well. The placenta was removed Intact and  appeared normal and sent to pathology.  The uterine outline, tubes and ovaries appeared normal.  The uterine incision was closed with running locked sutures of 0Vicryl and second imbricating layer thereafter.   Hemostasis was observed. Lavage was carried out until clear. Peritoneum was closed using 2.0 vicryl. Fascia was then reapproximated with running sutures of 0Vicryl. The skin was closed with staples and sterile pressure dressing applied.    Instrument, sponge, and needle counts were correct prior the abdominal closure and were correct at the conclusion of the case.   Findings: Baby BOY delivered cephalic from OT position at 21.26 hrs, Apgars 8/9. Cord ph (arterial) 7.31. Weight pending. Normal tubes and ovaries./   Estimated Blood Loss: 700 cc  Total IV Fluids: LR (see anesthesia record) Urine Output: Hematuria clearing.  Specimens:  Cord gas, placenta to path.   Complications: no complications Disposition: PACU - hemodynamically stable.  Maternal Condition: stable  Baby condition / location:  nursery-stable  Signed: v.Diego Ulbricht, MD Surgeon(s): Robley Fries

## 2011-05-13 NOTE — Anesthesia Postprocedure Evaluation (Signed)
Anesthesia Post Note  Patient: Felicia Mccoy  Procedure(s) Performed:  CESAREAN SECTION  Anesthesia type: Epidural  Patient location: Mother/Baby  Post pain: Pain level controlled  Post assessment: Post-op Vital signs reviewed  Last Vitals:  Filed Vitals:   05/13/11 2215  BP: 121/65  Pulse: 124  Temp:   Resp: 16    Post vital signs: Reviewed  Level of consciousness: awake  Complications: No apparent anesthesia complications

## 2011-05-13 NOTE — ED Notes (Signed)
Report called to Ashley RN in BS 

## 2011-05-13 NOTE — Progress Notes (Signed)
S/p Epidural, comfortable. Pitocin restarted. VS stable. FHT 145/ + accels/ no decels/ moderate variability- category I. Toco q 3 min. SVE 5/90%/-3-2/ thin meconium. Cont to rotate positions and monitor progress

## 2011-05-13 NOTE — Progress Notes (Signed)
Dr. Juliene Pina in to recheck cervix. RN updated MD of FHR, Maternal pulse, recent temp of 100.3 axillary. MD aware. MD placing orders for antibiotics and Tylenol.

## 2011-05-13 NOTE — Progress Notes (Signed)
Dr Juliene Pina notified of pt's admission and status. Aware of pos fern, sve, Bell's Palsy with weakness L side of face, neg GBS. Will admit to BS.

## 2011-05-13 NOTE — Progress Notes (Signed)
Pt evaluated again. Pulse still high but feels better since tylenol. 1 gm cefoxitin going in. FHT 155/ moderate variability/ overall no decels/ category I Toco - on pitocin 170 MV units/  SVE unchanged at 9 cm with thick rim b/w head and pubic symphysis, hematuria.  Plan to proceed with cesarean delivery ASAP for arrest of dilatation, chorioamnionitis.  IV LR bolus 500 cc, stat CBC. Anesthesia and OR informed. Informed written consent obtained, risks/ complications reviewed, patient understands and agrees.

## 2011-05-14 LAB — CBC
HCT: 33.9 % — ABNORMAL LOW (ref 36.0–46.0)
Hemoglobin: 11.6 g/dL — ABNORMAL LOW (ref 12.0–15.0)
MCH: 27.2 pg (ref 26.0–34.0)
MCHC: 34.2 g/dL (ref 30.0–36.0)
MCV: 79.6 fL (ref 78.0–100.0)
RBC: 4.26 MIL/uL (ref 3.87–5.11)

## 2011-05-14 MED ORDER — ACETAMINOPHEN 10 MG/ML IV SOLN
1000.0000 mg | Freq: Four times a day (QID) | INTRAVENOUS | Status: AC | PRN
  Filled 2011-05-14: qty 100

## 2011-05-14 MED ORDER — KETOROLAC TROMETHAMINE 30 MG/ML IJ SOLN: 30.0000 mg | Freq: Four times a day (QID) | INTRAMUSCULAR | Status: AC | PRN

## 2011-05-14 MED ORDER — SIMETHICONE 80 MG PO CHEW
80.0000 mg | CHEWABLE_TABLET | Freq: Three times a day (TID) | ORAL | Status: DC
  Administered 2011-05-14 – 2011-05-16 (×9): 80 mg via ORAL

## 2011-05-14 MED ORDER — OXYTOCIN 20 UNITS IN LACTATED RINGERS INFUSION - SIMPLE: 125.0000 mL/h | INTRAVENOUS | Status: AC

## 2011-05-14 MED ORDER — TETANUS-DIPHTH-ACELL PERTUSSIS 5-2.5-18.5 LF-MCG/0.5 IM SUSP: 0.5000 mL | Freq: Once | INTRAMUSCULAR | Status: DC

## 2011-05-14 MED ORDER — ONDANSETRON HCL 4 MG/2ML IJ SOLN: 4.0000 mg | Freq: Three times a day (TID) | INTRAMUSCULAR | Status: DC | PRN

## 2011-05-14 MED ORDER — SENNOSIDES-DOCUSATE SODIUM 8.6-50 MG PO TABS
2.0000 | ORAL_TABLET | Freq: Every day | ORAL | Status: DC
  Administered 2011-05-14 – 2011-05-15 (×2): 2 via ORAL

## 2011-05-14 MED ORDER — DIBUCAINE 1 % RE OINT: 1.0000 "application " | TOPICAL_OINTMENT | RECTAL | Status: DC | PRN

## 2011-05-14 MED ORDER — LANOLIN HYDROUS EX OINT: 1.0000 "application " | TOPICAL_OINTMENT | CUTANEOUS | Status: DC | PRN

## 2011-05-14 MED ORDER — SIMETHICONE 80 MG PO CHEW: 80.0000 mg | CHEWABLE_TABLET | ORAL | Status: DC | PRN

## 2011-05-14 MED ORDER — SODIUM CHLORIDE 0.9 % IV SOLN
1.0000 ug/kg/h | INTRAVENOUS | Status: DC | PRN
  Filled 2011-05-14: qty 2.5

## 2011-05-14 MED ORDER — ONDANSETRON HCL 4 MG PO TABS: 4.0000 mg | ORAL_TABLET | ORAL | Status: DC | PRN

## 2011-05-14 MED ORDER — DIPHENHYDRAMINE HCL 25 MG PO CAPS: 25.0000 mg | ORAL_CAPSULE | ORAL | Status: DC | PRN

## 2011-05-14 MED ORDER — PRENATAL PLUS 27-1 MG PO TABS
1.0000 | ORAL_TABLET | Freq: Every day | ORAL | Status: DC
  Administered 2011-05-15 – 2011-05-16 (×3): 1 via ORAL
  Filled 2011-05-14 (×2): qty 1

## 2011-05-14 MED ORDER — NALOXONE HCL 0.4 MG/ML IJ SOLN: 0.4000 mg | INTRAMUSCULAR | Status: DC | PRN

## 2011-05-14 MED ORDER — DIPHENHYDRAMINE HCL 50 MG/ML IJ SOLN: 25.0000 mg | INTRAMUSCULAR | Status: DC | PRN

## 2011-05-14 MED ORDER — LACTATED RINGERS IV SOLN
INTRAVENOUS | Status: DC
  Administered 2011-05-14: 01:00:00 via INTRAVENOUS

## 2011-05-14 MED ORDER — DIPHENHYDRAMINE HCL 25 MG PO CAPS: 25.0000 mg | ORAL_CAPSULE | Freq: Four times a day (QID) | ORAL | Status: DC | PRN

## 2011-05-14 MED ORDER — ONDANSETRON HCL 4 MG/2ML IJ SOLN: 4.0000 mg | INTRAMUSCULAR | Status: DC | PRN

## 2011-05-14 MED ORDER — WITCH HAZEL-GLYCERIN EX PADS: 1.0000 "application " | MEDICATED_PAD | CUTANEOUS | Status: DC | PRN

## 2011-05-14 MED ORDER — IBUPROFEN 600 MG PO TABS
600.0000 mg | ORAL_TABLET | Freq: Four times a day (QID) | ORAL | Status: DC
  Administered 2011-05-14 – 2011-05-16 (×9): 600 mg via ORAL
  Filled 2011-05-14 (×9): qty 1

## 2011-05-14 MED ORDER — METOCLOPRAMIDE HCL 5 MG/ML IJ SOLN: 10.0000 mg | Freq: Three times a day (TID) | INTRAMUSCULAR | Status: DC | PRN

## 2011-05-14 MED ORDER — OXYCODONE-ACETAMINOPHEN 5-325 MG PO TABS
1.0000 | ORAL_TABLET | ORAL | Status: DC | PRN
  Administered 2011-05-16: 1 via ORAL
  Filled 2011-05-14: qty 1

## 2011-05-14 MED ORDER — IBUPROFEN 600 MG PO TABS: 600.0000 mg | ORAL_TABLET | Freq: Four times a day (QID) | ORAL | Status: DC | PRN

## 2011-05-14 MED ORDER — DIPHENHYDRAMINE HCL 50 MG/ML IJ SOLN: 12.5000 mg | INTRAMUSCULAR | Status: DC | PRN

## 2011-05-14 MED ORDER — SODIUM CHLORIDE 0.9 % IJ SOLN: 3.0000 mL | INTRAMUSCULAR | Status: DC | PRN

## 2011-05-14 MED ORDER — MENTHOL 3 MG MT LOZG: 1.0000 | LOZENGE | OROMUCOSAL | Status: DC | PRN

## 2011-05-14 MED ORDER — ZOLPIDEM TARTRATE 5 MG PO TABS: 5.0000 mg | ORAL_TABLET | Freq: Every evening | ORAL | Status: DC | PRN

## 2011-05-14 NOTE — Anesthesia Postprocedure Evaluation (Signed)
Anesthesia Post Note  Patient: Felicia Mccoy  Procedure(s) Performed:  CESAREAN SECTION  Anesthesia type: Epidural  Patient location: Mother/Baby  Post pain: Pain level controlled  Post assessment: Post-op Vital signs reviewed  Last Vitals:  Filed Vitals:   05/14/11 0615  BP: 109/75  Pulse: 106  Temp: 36.6 C  Resp: 24    Post vital signs: Reviewed  Level of consciousness: awake  Complications: No apparent anesthesia complications

## 2011-05-14 NOTE — Progress Notes (Signed)
Subjective: Postpartum Day 1Cesarean Delivery Patient reports tolerating PO and + flatus. Feeling well, a little tired; pain controlled with percocet. Objective: Vital signs in last 24 hours: Temp:  [97.8 F (36.6 C)-100.6 F (38.1 C)] 98.5 F (36.9 C) (11/04 0830) Pulse Rate:  [91-157] 96  (11/04 0830) Resp:  [16-29] 18  (11/04 0830) BP: (94-141)/(50-100) 96/64 mmHg (11/04 0830) SpO2:  [92 %-100 %] 94 % (11/04 0830)  Physical Exam:  General: alert, cooperative and no distress Pulmonary: Unlabored and clear bilaterally GI: active bowel sounds in all four quadrants.  Lochia: appropriate; scant rubra Uterine Fundus: U-2 Incision: dressing intact; no evidence of drainage or active bleeding on dressing DVT Evaluation: No evidence of DVT seen on physical exam. Negative Homan's sign. No cords or calf tenderness. No significant calf/ankle edema.   Basename 05/14/11 0515 05/13/11 2100  HGB 11.6* 11.6*  HCT 33.9* 33.6*    Assessment/Plan: Status post Cesarean section. Doing well postoperatively.  Continue current care. Breastfeeding- Lactation consultation today Anticipate DC on 11/6  Juanetta Beets [facility]  [facility] 05/14/2011, 10:06 AM

## 2011-05-14 NOTE — Addendum Note (Signed)
Addendum  created 05/14/11 0835 by Doreene Burke   Modules edited:Notes Section

## 2011-05-15 LAB — ABO/RH: ABO/RH(D): O POS

## 2011-05-15 MED ORDER — PREDNISONE 20 MG PO TABS
20.0000 mg | ORAL_TABLET | Freq: Three times a day (TID) | ORAL | Status: DC
  Administered 2011-05-15 – 2011-05-16 (×4): 20 mg via ORAL
  Filled 2011-05-15 (×7): qty 1

## 2011-05-15 MED ORDER — VALACYCLOVIR HCL 500 MG PO TABS
1000.0000 mg | ORAL_TABLET | Freq: Three times a day (TID) | ORAL | Status: DC
  Administered 2011-05-15 – 2011-05-16 (×4): 1000 mg via ORAL
  Filled 2011-05-15 (×7): qty 2

## 2011-05-15 NOTE — Progress Notes (Signed)
  S:        Reports feeling better today            Tolerating po intake / no nausea / no vomiting / + some flatus / no BM            Bleeding is light            Pain controlled prescription NSAID's including motrin and narcotic analgesics including percocet            Up ad lib / ambulatory  Newborn breast feeding  / Circumcision done   O:  A & O x 3 NAD             VS: Blood pressure 103/71, pulse 88, temperature 97.7 F (36.5 C), temperature source Oral, resp. rate 18, height 5\' 2"  (1.575 m), weight 71.215 kg (157 lb), SpO2 94.00%, unknown if currently breastfeeding.  LABS:  Lab Results  Component Value Date   WBC 17.4* 05/14/2011   HGB 11.6* 05/14/2011   HCT 33.9* 05/14/2011   MCV 79.6 05/14/2011   PLT 228 05/14/2011     I&O: I/O last 3 completed shifts: In: 3260 [P.O.:360; I.V.:2800; IV Piggyback:100] Out: 3850 [Urine:3150; Blood:700]      Lungs: Clear and unlabored  Heart: regular rate and rhythm / no mumurs  Abdomen: soft, non-tender, non-distended, active bowel sounds             Fundus: firm, non-tender, Ueven             Dressing intact no drainage            Perineum: no edema  Lochia: light  Extremities: trace edema, no calf pain or tenderness, negative Homans  A:        POD # 2 S/P cesarean section            Bells Palsy              P:        Routine postoperative care              Continue Valtrex and prednisone as ordered ( RX at home to complete 10 day course)             Valtrex 1gm TID and prednisone 60 mg daily     Felicia Mccoy 05/15/2011, 9:09 AM

## 2011-05-16 ENCOUNTER — Encounter (HOSPITAL_COMMUNITY): Payer: Self-pay | Admitting: Obstetrics & Gynecology

## 2011-05-16 MED ORDER — DOCUSATE SODIUM 100 MG PO CAPS: 100.0000 mg | ORAL_CAPSULE | Freq: Two times a day (BID) | ORAL | Status: DC

## 2011-05-16 MED ORDER — IBUPROFEN 600 MG PO TABS: 600.0000 mg | ORAL_TABLET | Freq: Four times a day (QID) | ORAL | Status: AC

## 2011-05-16 MED ORDER — OXYCODONE-ACETAMINOPHEN 5-325 MG PO TABS: 1.0000 | ORAL_TABLET | Freq: Four times a day (QID) | ORAL | Status: AC | PRN

## 2011-05-16 NOTE — Discharge Summary (Signed)
Obstetric Discharge Summary Reason for Admission: rupture of membranes and 38 wks Prenatal Procedures: ultrasound Intrapartum Procedures: cesarean: low cervical, transverse Postpartum Procedures: none Complications-Operative and Postpartum: none Hemoglobin  Date Value Range Status  05/14/2011 11.6* 12.0-15.0 (g/dL) Final     HCT  Date Value Range Status  05/14/2011 33.9* 36.0-46.0 (%) Final    Discharge Diagnoses: Term Pregnancy-delivered and Bell's Palsy  Discharge Information: Date: 05/16/2011 Activity: pelvic rest and ambulate in halls and room Diet: routine Medications: Ibuprofen, Colace, Percocet and advised to resume valtrex and prednisone at home as previously prescribed Condition: stable Instructions: refer to practice specific booklet Discharge to: home Follow-up Information    Follow up with LAVOIE,MARIE-LYNE in 6 weeks.   Contact information:   [address] [place] [address] [phone]          Newborn Data: Live born female on 05/13/11 Birth Weight: 6 lb 7.5 oz (2935 g) APGAR: 8, 9  Home with mother.  Felicia Mccoy 05/16/2011, 10:20 AM

## 2011-05-16 NOTE — Progress Notes (Signed)
  POD # 3  Subjective: Pt reports feeling "ok"/ Pain under moderate control with prescription NSAID's including motrin and percocet.  However, taking minimal percocet and having poor progression with ambulation. Tolerating po/Voiding without problems/ No n/v/Flatus pos, but "feels constipated" Activity: walk in room and walk in hall Bleeding is light Newborn info:  Information for the patient's newborn:  Kinzey, Sheriff [[id number]]  female  Circ complete per Dr Mody/Feeding: breast   Objective: VS: Blood pressure 124/86, pulse 73, temperature 97.8 F (36.6 C), temperature source Oral, resp. rate 20    Physical Exam:  General: alert, cooperative and no distress HEENT: right side facial paralysis noted CV: Regular rate and rhythm Resp: clear Abdomen: soft, NT, hypoactive BS x 4 quads Incision: healing well, no erythema, well approximated, will plan staple removal prior to d/c home Uterine Fundus: firm, below umbilicus, nontender Lochia: minimal Ext: edema +1 and Homans sign is negative, no sign of DVT    A/P: POD # 3/ G1P1001 Bells Palsy, to resume medications as previously prescribed Doing well and stable for discharge home RX: Ibuprofen 600mg  po Q 6 hrs prn pain #30 Refill x 1 Percocet 5/325 1 - 2 tabs po every 6 hrs prn pain  #30 No refill Colace 100mg  1 to 2 times per day prn constipation #30 refill x 1 Urged increased po fluids, warm fluids to help constipation symptoms and increase ambulation Staple removal prior to d/c home Rt pp visit in 6 weeks

## 2011-05-30 ENCOUNTER — Encounter: Payer: Self-pay | Admitting: Family Medicine

## 2011-05-31 ENCOUNTER — Encounter: Payer: Self-pay | Admitting: Family Medicine

## 2011-05-31 ENCOUNTER — Ambulatory Visit (INDEPENDENT_AMBULATORY_CARE_PROVIDER_SITE_OTHER): Payer: BC Managed Care – PPO | Admitting: Family Medicine

## 2011-05-31 VITALS — BP 110/72 | HR 85 | Temp 98.2°F | Ht 60.5 in | Wt 134.4 lb

## 2011-05-31 DIAGNOSIS — G510 Bell's palsy: Secondary | ICD-10-CM

## 2011-05-31 NOTE — Progress Notes (Signed)
  Patient Name: Felicia Mccoy Date of Birth: 10-06-1975 Age: 35 y.o. Medical Record Number: [id number] Gender: female  History of Present Illness:  Felicia Mccoy is a 35 y.o. 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   [redacted] weeks pregnant and developed some left sided facil drooping and could not close her left eye. Went to the ER.   Took medication - prednisone and valtrex.  Now is coming back a little bit.   Diagnosed with Bell's palsy 4 weeks ago while she was pregnant. She is 2 weeks postpartum currently. She finished her course of Valtrex and prednisone about a week ago. She has had some improvement in her left-sided Bell's palsy, but still has some nerve impairment and impaired motor function on the left. She is here wanting some advice about how to proceed.  Past Medical History, Surgical History, Social History, Family History, and Problem List have been reviewed in EHR and updated if relevant.  Review of Systems:  GEN: No acute illnesses, no fevers, chills aside from bell's GI: No n/v/d, eating normally Pulm: No SOB Interactive and getting along well at home.  Otherwise, ROS is as per the HPI.   Physical Examination: Filed Vitals:   05/31/11 1015  BP: 110/72  Pulse: 85  Temp: 98.2 F (36.8 C)  TempSrc: Oral  Height: 5' 0.5" (1.537 m)  Weight: 134 lb 6.4 oz (60.963 kg)  SpO2: 100%     GEN: WDWN, NAD, Non-toxic, Alert & Oriented x 3 HEENT: Atraumatic, Normocephalic.  Ears and Nose: No external deformity. EXTR: No clubbing/cyanosis/edema NEURO: Normal gait. Left-sided facial nerve partial palsy. Remainder of cranial nerves are intact. PSYCH: Normally interactive. Conversant. Not depressed or anxious appearing.  Calm demeanor.    Assessment and Plan: 1. Bell's palsy     >25 minutes spent in face to face time with patient, >50% spent in counselling or coordination of care: I  did best with her to try to review everything about Bell's palsy and answer all of her questions. I reviewed with her that it is idiopathic, and that it often takes 6-8 months for resolution of symptoms. We also discussed that sometimes Bell's palsy does lead to permanent nerve impairment. The patient asked multiple times for refills on her prednisone as well as her Valtrex. I explained to her that there is no clinical indication or research that would show that would be helpful, and I think it would potentially be harmful to her, so I could not in good conscience refill her medication.

## 2011-11-30 ENCOUNTER — Encounter: Payer: Self-pay | Admitting: Family Medicine

## 2011-11-30 ENCOUNTER — Ambulatory Visit (INDEPENDENT_AMBULATORY_CARE_PROVIDER_SITE_OTHER): Payer: BC Managed Care – PPO | Admitting: Family Medicine

## 2011-11-30 VITALS — BP 110/70 | HR 80 | Temp 98.3°F | Ht 60.0 in | Wt 132.8 lb

## 2011-11-30 DIAGNOSIS — K279 Peptic ulcer, site unspecified, unspecified as acute or chronic, without hemorrhage or perforation: Secondary | ICD-10-CM

## 2011-11-30 DIAGNOSIS — Z13220 Encounter for screening for lipoid disorders: Secondary | ICD-10-CM

## 2011-11-30 DIAGNOSIS — K219 Gastro-esophageal reflux disease without esophagitis: Secondary | ICD-10-CM

## 2011-11-30 LAB — COMPREHENSIVE METABOLIC PANEL
AST: 17 U/L (ref 0–37)
Alkaline Phosphatase: 77 U/L (ref 39–117)
BUN: 11 mg/dL (ref 6–23)
Calcium: 9.3 mg/dL (ref 8.4–10.5)
Creatinine, Ser: 0.6 mg/dL (ref 0.4–1.2)
Total Bilirubin: 0.5 mg/dL (ref 0.3–1.2)

## 2011-11-30 LAB — LIPID PANEL
HDL: 50.5 mg/dL (ref >39.00)
Total CHOL/HDL Ratio: 4
Triglycerides: 67 mg/dL (ref 0.0–149.0)
VLDL: 13.4 mg/dL (ref 0.0–40.0)

## 2011-11-30 LAB — LDL CHOLESTEROL, DIRECT: Direct LDL: 145.6 mg/dL

## 2011-11-30 NOTE — Progress Notes (Signed)
  Subjective:    Patient ID: Felicia Mccoy, female    DOB: 1976/04/30, 36 y.o.   MRN: [id number]  HPI Here for follow up.  Had child 6 month ago, doing well overall. Back at work now.  PUD, GERD: no abdominal pain, no reflux... Not requiring any medication.  Saw GYN for annual exam.. Pap and breast exam. Diet: good Exercise: daily, walking  Review of Systems  Constitutional: Negative for fever and fatigue.  HENT: Negative for ear pain.   Eyes: Negative for pain.  Respiratory: Negative for cough and shortness of breath.   Cardiovascular: Negative for chest pain, palpitations and leg swelling.  Gastrointestinal: Negative for abdominal pain.  Genitourinary: Negative for dysuria.  Neurological: Negative for syncope.  Psychiatric/Behavioral: Negative for dysphoric mood.       Objective:   Physical Exam  Constitutional: Vital signs are normal. She appears well-developed and well-nourished. She is cooperative.  Non-toxic appearance. She does not appear ill. No distress.  HENT:  Head: Normocephalic.  Right Ear: Hearing, tympanic membrane, external ear and ear canal normal.  Left Ear: Hearing, tympanic membrane, external ear and ear canal normal.  Nose: Nose normal.  Eyes: Conjunctivae, EOM and lids are normal. Pupils are equal, round, and reactive to light. No foreign bodies found.  Neck: Trachea normal and normal range of motion. Neck supple. Carotid bruit is not present. No mass and no thyromegaly present.  Cardiovascular: Normal rate, regular rhythm, S1 normal, S2 normal, normal heart sounds and intact distal pulses.  Exam reveals no gallop.   No murmur heard. Pulmonary/Chest: Effort normal and breath sounds normal. No respiratory distress. She has no wheezes. She has no rhonchi. She has no rales.  Abdominal: Soft. Normal appearance and bowel sounds are normal. She exhibits no distension, no fluid wave, no abdominal bruit and no mass. There is no hepatosplenomegaly. There is no  tenderness. There is no rebound, no guarding and no CVA tenderness. No hernia.  Lymphadenopathy:    She has no cervical adenopathy.    She has no axillary adenopathy.  Neurological: She is alert. She has normal strength. No cranial nerve deficit or sensory deficit.  Skin: Skin is warm, dry and intact. No rash noted.  Psychiatric: Her speech is normal and behavior is normal. Judgment normal. Her mood appears not anxious. Cognition and memory are normal. She does not exhibit a depressed mood.          Assessment & Plan:  The patient's preventative maintenance and recommended screening tests were reviewed in full today. Brought up to date unless services declined.  Counselled on the importance of diet, exercise, and its role in overall health and mortality. The patient's FH and SH was reviewed, including their home life, tobacco status, and drug and alcohol status.   PAp/DVE: per gyn Breast: per GYN Vaccines: Td 2008  Nonsmoker:

## 2011-11-30 NOTE — Assessment & Plan Note (Signed)
No current issues with gastric pain

## 2011-11-30 NOTE — Assessment & Plan Note (Signed)
Well controlled, on no med.

## 2011-12-05 ENCOUNTER — Encounter: Payer: Self-pay | Admitting: *Deleted

## 2012-04-15 ENCOUNTER — Telehealth: Payer: Self-pay | Admitting: *Deleted

## 2012-04-15 NOTE — Telephone Encounter (Signed)
Patient's husband came by the office and requested medication for her because she is taking a 15 hours flight to the [place] Patient needs medication to help calm her down during the flight. Pharmacy [facility]

## 2012-04-16 MED ORDER — ALPRAZOLAM 0.25 MG PO TABS: 0.2500 mg | ORAL_TABLET | Freq: Four times a day (QID) | ORAL | Status: DC | PRN

## 2012-04-16 NOTE — Telephone Encounter (Signed)
Rx called to pharmacy. Patient's husband notified by telephone.

## 2012-11-27 ENCOUNTER — Other Ambulatory Visit: Payer: 59

## 2012-11-29 ENCOUNTER — Encounter: Payer: Self-pay | Admitting: Radiology

## 2012-12-03 ENCOUNTER — Ambulatory Visit (INDEPENDENT_AMBULATORY_CARE_PROVIDER_SITE_OTHER): Payer: BC Managed Care – PPO | Admitting: Family Medicine

## 2012-12-03 ENCOUNTER — Encounter: Payer: Self-pay | Admitting: Family Medicine

## 2012-12-03 VITALS — BP 110/60 | HR 71 | Temp 98.2°F | Ht 60.0 in | Wt 120.2 lb

## 2012-12-03 DIAGNOSIS — M259 Joint disorder, unspecified: Secondary | ICD-10-CM

## 2012-12-03 DIAGNOSIS — M222X2 Patellofemoral disorders, left knee: Secondary | ICD-10-CM | POA: Insufficient documentation

## 2012-12-03 DIAGNOSIS — M62838 Other muscle spasm: Secondary | ICD-10-CM

## 2012-12-03 DIAGNOSIS — Z13220 Encounter for screening for lipoid disorders: Secondary | ICD-10-CM

## 2012-12-03 LAB — LIPID PANEL
HDL: 53.7 mg/dL (ref >39.00)
VLDL: 15.4 mg/dL (ref 0.0–40.0)

## 2012-12-03 LAB — COMPREHENSIVE METABOLIC PANEL
ALT: 14 U/L (ref 0–35)
CO2: 24 mEq/L (ref 19–32)
Creatinine, Ser: 0.5 mg/dL (ref 0.4–1.2)
GFR: 139.26 mL/min (ref >60.00)
Total Bilirubin: 0.6 mg/dL (ref 0.3–1.2)

## 2012-12-03 NOTE — Assessment & Plan Note (Signed)
NSAIDs Heat and stretches. Evaluate ergonomics at work.

## 2012-12-03 NOTE — Progress Notes (Signed)
Subjective:    Patient ID: Felicia Mccoy, female    DOB: 1976-04-20, 37 y.o.   MRN: [id number]  HPI  The patient is here for  yearly check  She has noted pain in left upper back... 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   Pain in B knees. Was worse when she came back to the area.  Now more mild but flares at times. Cannot  Squat to floor.  No fall.  Pain most after sitting a while then getting up to move. No clicking or popping.  Not using any medication... Heat helps.   She sees GYN for CPX and PAP/DVE.  She requests screening labs for cholesterol/DM for work.   Review of Systems  Constitutional: Negative for fever and fatigue.  HENT: Negative for ear pain.   Eyes: Negative for pain.  Respiratory: Negative for chest tightness and shortness of breath.   Cardiovascular: Negative for chest pain, palpitations and leg swelling.  Gastrointestinal: Negative for abdominal pain.  Genitourinary: Negative for dysuria.       Objective:   Physical Exam  Constitutional: Vital signs are normal. She appears well-developed and well-nourished. She is cooperative.  Non-toxic appearance. She does not appear ill. No distress.  HENT:  Head: Normocephalic.  Right Ear: Hearing, tympanic membrane, external ear and ear canal normal. Tympanic membrane is not erythematous, not retracted and not bulging.  Left Ear: Hearing, tympanic membrane, external ear and ear canal normal. Tympanic membrane is not erythematous, not retracted and not bulging.  Nose: No mucosal edema or rhinorrhea. Right sinus exhibits no maxillary sinus tenderness and no frontal sinus tenderness. Left sinus exhibits no maxillary sinus tenderness and no frontal sinus tenderness.  Mouth/Throat: Uvula is midline, oropharynx is clear and moist and mucous membranes are normal.  Eyes: Conjunctivae, EOM and lids are normal. Pupils are equal, round, and reactive to light. No foreign bodies found.  Neck: Trachea normal and  normal range of motion. Neck supple. Carotid bruit is not present. No mass and no thyromegaly present.  Cardiovascular: Normal rate, regular rhythm, S1 normal, S2 normal, normal heart sounds, intact distal pulses and normal pulses.  Exam reveals no gallop and no friction rub.   No murmur heard. Pulmonary/Chest: Effort normal and breath sounds normal. Not tachypneic. No respiratory distress. She has no decreased breath sounds. She has no wheezes. She has no rhonchi. She has no rales.  Abdominal: Soft. Normal appearance and bowel sounds are normal. There is no tenderness.  Musculoskeletal:       Left knee: She exhibits normal range of motion, no swelling, no effusion and no deformity. No tenderness found. No medial joint line, no lateral joint line, no MCL, no LCL and no patellar tendon tenderness noted.       Cervical back: She exhibits tenderness. She exhibits normal range of motion.  Crepitus, neg MCmurray's left knee  Neck.. ttp left trapezius, neg spurling's  Neurological: She is alert.  Skin: Skin is warm, dry and intact. No rash noted.  Psychiatric: Her speech is normal and behavior is normal. Judgment and thought content normal. Her mood appears not anxious. Cognition and memory are normal. She does not exhibit a depressed mood.          Assessment & Plan:  The patient's preventative maintenance and recommended screening tests for an annual wellness exam were reviewed in full today. Brought up to date unless services declined.  Counselled on the importance of diet, exercise, and its role in  overall health and mortality. The patient's FH and SH was reviewed, including their home life, tobacco status, and drug and alcohol status.   Vaccines: Td uptodate  PaP/DVE: per GYN.. Scheduled in 12/2012  Mammo: no family history of breast cancer.  Nonsmoker

## 2012-12-03 NOTE — Patient Instructions (Addendum)
Stop at lab on way out.  Start glucosamine 500 mg 1-3 times a day. Start neck and knee exercises.  Can use ibuprofen 800 mg every 8 hours as needed for pain.

## 2012-12-03 NOTE — Assessment & Plan Note (Signed)
Info given. Start glucosamine, strengthen VMO. Start exercises for home PT. NSAIDs prn.

## 2012-12-23 ENCOUNTER — Telehealth: Payer: Self-pay | Admitting: Family Medicine

## 2012-12-23 DIAGNOSIS — M222X2 Patellofemoral disorders, left knee: Secondary | ICD-10-CM

## 2012-12-23 NOTE — Telephone Encounter (Signed)
Pt is still having trouble with her right knee and would like a referral to an orthopedic.  Can you refer her? Thank you.

## 2012-12-23 NOTE — Telephone Encounter (Signed)
Done

## 2014-05-11 ENCOUNTER — Encounter: Payer: Self-pay | Admitting: Family Medicine

## 2016-02-02 ENCOUNTER — Ambulatory Visit (INDEPENDENT_AMBULATORY_CARE_PROVIDER_SITE_OTHER): Payer: 59 | Admitting: Obstetrics and Gynecology

## 2016-02-02 ENCOUNTER — Encounter: Payer: Self-pay | Admitting: Obstetrics and Gynecology

## 2016-02-02 VITALS — BP 120/80 | HR 80 | Resp 16 | Ht 60.75 in | Wt 127.0 lb

## 2016-02-02 DIAGNOSIS — Z124 Encounter for screening for malignant neoplasm of cervix: Secondary | ICD-10-CM

## 2016-02-02 DIAGNOSIS — Z01419 Encounter for gynecological examination (general) (routine) without abnormal findings: Secondary | ICD-10-CM | POA: Diagnosis not present

## 2016-02-02 NOTE — Patient Instructions (Addendum)
EXERCISE AND DIET:  We recommended that you start or continue a regular exercise program for good health. Regular exercise means any activity that makes your heart beat faster and makes you sweat.  We recommend exercising at least 30 minutes per day at least 3 days a week, preferably 4 or 5.  We also recommend a diet low in fat and sugar.  Inactivity, poor dietary choices and obesity can cause diabetes, heart attack, stroke, and kidney damage, among others.    ALCOHOL AND SMOKING:  Women should limit their alcohol intake to no more than 7 drinks/beers/glasses of wine (combined, not each!) per week. Moderation of alcohol intake to this level decreases your risk of breast cancer and liver damage. And of course, no recreational drugs are part of a healthy lifestyle.  And absolutely no smoking or even second hand smoke. Most people know smoking can cause heart and lung diseases, but did you know it also contributes to weakening of your bones? Aging of your skin?  Yellowing of your teeth and nails?  CALCIUM AND VITAMIN D:  Adequate intake of calcium and Vitamin D are recommended.  The recommendations for exact amounts of these supplements seem to change often, but generally speaking 600 mg of calcium (either carbonate or citrate) and 800 units of Vitamin D per day seems prudent. Certain women may benefit from higher intake of Vitamin D.  If you are among these women, your doctor will have told you during your visit.    PAP SMEARS:  Pap smears, to check for cervical cancer or precancers,  have traditionally been done yearly, although recent scientific advances have shown that most women can have pap smears less often.  However, every woman still should have a physical exam from her gynecologist every year. It will include a breast check, inspection of the vulva and vagina to check for abnormal growths or skin changes, a visual exam of the cervix, and then an exam to evaluate the size and shape of the uterus and  ovaries.  And after 38 years of age, a rectal exam is indicated to check for rectal cancers. We will also provide age appropriate advice regarding health maintenance, like when you should have certain vaccines, screening for sexually transmitted diseases, bone density testing, colonoscopy, mammograms, etc.   MAMMOGRAMS:  All women over 40 years old should have a yearly mammogram. Many facilities now offer a "3D" mammogram, which may cost around $50 extra out of pocket. If possible,  we recommend you accept the option to have the 3D mammogram performed.  It both reduces the number of women who will be called back for extra views which then turn out to be normal, and it is better than the routine mammogram at detecting truly abnormal areas.    COLONOSCOPY:  Colonoscopy to screen for colon cancer is recommended for all women at age 50.  We know, you hate the idea of the prep.  We agree, BUT, having colon cancer and not knowing it is worse!!  Colon cancer so often starts as a polyp that can be seen and removed at colonscopy, which can quite literally save your life!  And if your first colonoscopy is normal and you have no family history of colon cancer, most women don't have to have it again for 10 years.  Once every ten years, you can do something that may end up saving your life, right?  We will be happy to help you get it scheduled when you are ready.    Be sure to check your insurance coverage so you understand how much it will cost.  It may be covered as a preventative service at no cost, but you should check your particular policy.      Breast Self-Awareness Practicing breast self-awareness may pick up problems early, prevent significant medical complications, and possibly save your life. By practicing breast self-awareness, you can become familiar with how your breasts look and feel and if your breasts are changing. This allows you to notice changes early. It can also offer you some reassurance that your  breast health is good. One way to learn what is normal for your breasts and whether your breasts are changing is to do a breast self-exam. If you find a lump or something that was not present in the past, it is best to contact your caregiver right away. Other findings that should be evaluated by your caregiver include nipple discharge, especially if it is bloody; skin changes or reddening; areas where the skin seems to be pulled in (retracted); or new lumps and bumps. Breast pain is seldom associated with cancer (malignancy), but should also be evaluated by a caregiver. HOW TO PERFORM A BREAST SELF-EXAM The best time to examine your breasts is 5-7 days after your menstrual period is over. During menstruation, the breasts are lumpier, and it may be more difficult to pick up changes. If you do not menstruate, have reached menopause, or had your uterus removed (hysterectomy), you should examine your breasts at regular intervals, such as monthly. If you are breastfeeding, examine your breasts after a feeding or after using a breast pump. Breast implants do not decrease the risk for lumps or tumors, so continue to perform breast self-exams as recommended. Talk to your caregiver about how to determine the difference between the implant and breast tissue. Also, talk about the amount of pressure you should use during the exam. Over time, you will become more familiar with the variations of your breasts and more comfortable with the exam. A breast self-exam requires you to remove all your clothes above the waist. 1. Look at your breasts and nipples. Stand in front of a mirror in a room with good lighting. With your hands on your hips, push your hands firmly downward. Look for a difference in shape, contour, and size from one breast to the other (asymmetry). Asymmetry includes puckers, dips, or bumps. Also, look for skin changes, such as reddened or scaly areas on the breasts. Look for nipple changes, such as discharge,  dimpling, repositioning, or redness. 2. Carefully feel your breasts. This is best done either in the shower or tub while using soapy water or when flat on your back. Place the arm (on the side of the breast you are examining) above your head. Use the pads (not the fingertips) of your three middle fingers on your opposite hand to feel your breasts. Start in the underarm area and use  inch (2 cm) overlapping circles to feel your breast. Use 3 different levels of pressure (light, medium, and firm pressure) at each circle before moving to the next circle. The light pressure is needed to feel the tissue closest to the skin. The medium pressure will help to feel breast tissue a little deeper, while the firm pressure is needed to feel the tissue close to the ribs. Continue the overlapping circles, moving downward over the breast until you feel your ribs below your breast. Then, move one finger-width towards the center of the body. Continue to use the    inch (2 cm) overlapping circles to feel your breast as you move slowly up toward the collar bone (clavicle) near the base of the neck. Continue the up and down exam using all 3 pressures until you reach the middle of the chest. Do this with each breast, carefully feeling for lumps or changes. 3.  Keep a written record with breast changes or normal findings for each breast. By writing this information down, you do not need to depend only on memory for size, tenderness, or location. Write down where you are in your menstrual cycle, if you are still menstruating. Breast tissue can have some lumps or thick tissue. However, see your caregiver if you find anything that concerns you.  SEEK MEDICAL CARE IF:  You see a change in shape, contour, or size of your breasts or nipples.   You see skin changes, such as reddened or scaly areas on the breasts or nipples.   You have an unusual discharge from your nipples.   You feel a new lump or unusually thick areas.     This information is not intended to replace advice given to you by your health care provider. Make sure you discuss any questions you have with your health care provider.   Document Released: 06/26/2005 Document Revised: 06/12/2012 Document Reviewed: 10/11/2011 Elsevier Interactive Patient Education 2016 [employer]  

## 2016-02-02 NOTE — Progress Notes (Signed)
40 y.o. G1P1001 MarriedAsianF here for annual exam.  No complaint. Sexually active without pain, condoms for contraception.  Son is 22 years old.  Period Cycle (Days): 30 Period Duration (Days): 3 Period Pattern: Regular Menstrual Flow: Heavy, Light Menstrual Control: Maxi pad Menstrual Control Change Freq (Hours): 4 times a day Dysmenorrhea: None  No BTB  Patient's last menstrual period was 01/22/2016.          Sexually active: Yes.    The current method of family planning is condoms most of the time.    Exercising: Yes.    Run, Walk Smoker:  no  Health Maintenance: Pap: 2012 WNL per patient History of abnormal Pap:  no TDaP:  03/11/2007    reports that she has never smoked. She has never used smokeless tobacco. She reports that she does not drink alcohol or use drugs. She is a [employer]arry manager, has to travel to work.   Past Medical History:  Diagnosis Date  . Bell's palsy complicating pregnancy    L facial weakness. Present for a week  . GERD (gastroesophageal reflux disease)   . Mental disorder   . No pertinent past medical history   . Panic attacks   . Ulcer     Past Surgical History:  Procedure Laterality Date  . CESAREAN SECTION  05/13/2011   Procedure: CESAREAN SECTION;  Surgeon: Robley Fries;  Location: [facility];  Service: Gynecology;  Laterality: N/A;  . NO PAST SURGERIES      No current outpatient prescriptions on file.   No current facility-administered medications for this visit.     History reviewed. No pertinent family history.  Review of Systems  Constitutional: Negative.   HENT: Negative.   Eyes: Negative.   Respiratory: Negative.   Cardiovascular: Negative.   Gastrointestinal: Negative.   Endocrine: Negative.   Genitourinary: Negative.   Musculoskeletal: Negative.   Skin: Negative.   Allergic/Immunologic: Negative.   Neurological: Negative.   Hematological: Negative.   Psychiatric/Behavioral: Negative.     Exam:   BP 120/80 (BP  Location: Right Arm, Patient Position: Sitting, Cuff Size: Normal)   Pulse 80   Resp 16   Ht 5' 0.75" (1.543 m)   Wt 127 lb (57.6 kg)   LMP 01/22/2016   BMI 24.19 kg/m   Weight change: @ Height:   Height: 5' 0.75" (154.3 cm)  Ht Readings from Last 3 Encounters:  02/02/16 5' 0.75" (1.543 m)  12/03/12 5' (1.524 m)  11/30/11 5' (1.524 m)    General appearance: alert, cooperative and appears stated age Head: Normocephalic, without obvious abnormality, atraumatic Neck: no adenopathy, supple, symmetrical, trachea midline and thyroid normal to inspection and palpation Lungs: clear to auscultation bilaterally Breasts: normal appearance, no masses or tenderness Heart: regular rate and rhythm Abdomen: soft, non-tender; bowel sounds normal; no masses,  no organomegaly Extremities: extremities normal, atraumatic, no cyanosis or edema Skin: Skin color, texture, turgor normal. No rashes or lesions Lymph nodes: Cervical, supraclavicular, and axillary nodes normal. No abnormal inguinal nodes palpated Neurologic: Grossly normal   Pelvic: External genitalia:  no lesions              Urethra:  normal appearing urethra with no masses, tenderness or lesions              Bartholins and Skenes: normal                 Vagina: normal appearing vagina with normal color and discharge, no lesions  Cervix: no lesions               Bimanual Exam:  Uterus:  normal size, contour, position, consistency, mobility, non-tender              Adnexa: no mass, fullness, tenderness               Rectovaginal: Confirms               Anus:  normal sphincter tone, no lesions  Chaperone was present for exam.  A:  Well Woman with normal exam  Contraception, will continue with condoms  P:   Pap with hpv  Discussed breast self exam  Discussed calcium and vit D intake  Labs through work "normal"

## 2016-02-04 LAB — IPS PAP TEST WITH HPV

## 2016-04-29 ENCOUNTER — Emergency Department (HOSPITAL_COMMUNITY)
Admission: EM | Admit: 2016-04-29 | Discharge: 2016-04-29 | Disposition: A | Discharge status: Home / Self Care | Payer: 59 | Attending: Emergency Medicine | Admitting: Emergency Medicine

## 2016-04-29 ENCOUNTER — Encounter (HOSPITAL_COMMUNITY): Payer: Self-pay | Admitting: *Deleted

## 2016-04-29 DIAGNOSIS — S0990XA Unspecified injury of head, initial encounter: Secondary | ICD-10-CM

## 2016-04-29 DIAGNOSIS — Y999 Unspecified external cause status: Secondary | ICD-10-CM | POA: Insufficient documentation

## 2016-04-29 DIAGNOSIS — W0110XA Fall on same level from slipping, tripping and stumbling with subsequent striking against unspecified object, initial encounter: Secondary | ICD-10-CM | POA: Diagnosis not present

## 2016-04-29 DIAGNOSIS — Y939 Activity, unspecified: Secondary | ICD-10-CM | POA: Diagnosis not present

## 2016-04-29 DIAGNOSIS — Y929 Unspecified place or not applicable: Secondary | ICD-10-CM | POA: Diagnosis not present

## 2016-04-29 DIAGNOSIS — S0093XA Contusion of unspecified part of head, initial encounter: Secondary | ICD-10-CM | POA: Diagnosis not present

## 2016-04-29 NOTE — ED Triage Notes (Addendum)
Patient states she slipped and fell in the bathtub this morning.  She denies LOC.  Patient has a hematoma to left parietal head.  Patient denies N/V and changes in vision.  Patient denies dizziness, but states, "i'm light headed."  Patient is ambulatory with a normal gait in triage.  Patient states she feels "much better now."  Patient is not taking anti-coagulants.

## 2016-04-29 NOTE — ED Provider Notes (Signed)
[facility]-EMERGENCY DEPT Provider Note   CSN: [id number] Arrival date & time: 04/29/16  1436     History   Chief Complaint Chief Complaint  Patient presents with  . Fall    HPI Felicia Mccoy is a 40 y.o. female.  40 year old female is here complaining of left-sided headache that began after she struck her head in the bathtub this morning. Denies any loss of consciousness. Denies any neck pain. Has had some swelling to her left parietal region. Denies any weakness in her arms or legs. No confusion. No vomiting. Pain characterized as sharp and has been persistent. No treatment used for this prior to arrival.      Past Medical History:  Diagnosis Date  . Bell's palsy complicating pregnancy    L facial weakness. Present for a week  . GERD (gastroesophageal reflux disease)   . Mental disorder   . No pertinent past medical history   . Panic attacks   . Ulcer [facility](HCC)     Patient Active Problem List   Diagnosis Date Noted  . 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 Trapezius muscle spasm 12/03/2012  .  Postpartum examination following cesarean delivery (11/3) 05/13/2011  . HELICOBACTER PYLORI GASTRITIS 12/20/2007  . PEPTIC ULCER DISEASE 12/20/2007  . GERD 05/22/2007    Past Surgical History:  Procedure Laterality Date  . CESAREAN SECTION  05/13/2011   Procedure: CESAREAN SECTION;  Surgeon: Robley FriesVaishali R Mody;  Location: [facility];  Service: Gynecology;  Laterality: N/A;  . NO PAST SURGERIES      OB History    Gravida Para Term Preterm AB Living   1 1 1     1    SAB TAB Ectopic Multiple Live Births           1       Home Medications    Prior to Admission medications   Not on File    Family History No family history on file.  Social History Social History  Substance Use Topics  . Smoking status: Never Smoker  . Smokeless tobacco: Never Used  . Alcohol use No     Allergies   Nubain [nalbuphine hcl] and Stadol [butorphanol tartrate]   Review of  Systems Review of Systems  All other systems reviewed and are negative.    Physical Exam Updated Vital Signs BP 115/69 (BP Location: Left Arm)   Pulse 101   Temp 98.7 F (37.1 C) (Oral)   Resp 18   Ht 5' 1.5" (1.562 m)   Wt 57 kg   LMP 04/21/2016 (Exact Date)   SpO2 100%   BMI 23.35 kg/m   Physical Exam  Constitutional: She is oriented to person, place, and time. She appears well-developed and well-nourished.  Non-toxic appearance. No distress.  HENT:  Head: Head is with contusion.    Eyes: Conjunctivae, EOM and lids are normal. Pupils are equal, round, and reactive to light.  Neck: Normal range of motion. Neck supple. No tracheal deviation present. No thyroid mass present.  Cardiovascular: Normal rate, regular rhythm and normal heart sounds.  Exam reveals no gallop.   No murmur heard. Pulmonary/Chest: Effort normal and breath sounds normal. No stridor. No respiratory distress. She has no decreased breath sounds. She has no wheezes. She has no rhonchi. She has no rales.  Abdominal: Soft. Normal appearance and bowel sounds are normal. She exhibits no distension. There is no tenderness. There is no rebound and no CVA tenderness.  Musculoskeletal: Normal range of motion. She exhibits  no edema or tenderness.  Neurological: She is alert and oriented to person, place, and time. She has normal strength. No cranial nerve deficit or sensory deficit. Coordination and gait normal. GCS eye subscore is 4. GCS verbal subscore is 5. GCS motor subscore is 6.  Skin: Skin is warm and dry. No abrasion and no rash noted.  Psychiatric: She has a normal mood and affect. Her speech is normal and behavior is normal.  Nursing note and vitals reviewed.    ED Treatments / Results  Labs (all labs ordered are listed, but only abnormal results are displayed) Labs Reviewed - No data to display  EKG  EKG Interpretation None       Radiology No results found.  Procedures Procedures (including  critical care time)  Medications Ordered in ED Medications - No data to display   Initial Impression / Assessment and Plan / ED Course  I have reviewed the triage vital signs and the nursing notes.  Pertinent labs & imaging results that were available during my care of the patient were reviewed by me and considered in my medical decision making (see chart for details).  Clinical Course    Patient has no focal neurological deficits. She is awake alert and oriented 4. She does not take blood thinners. She has had no vomiting or confusion. Patient and family are agreeable to not perform a CT of the head this time. Likely concussion and return precautions given  Final Clinical Impressions(s) / ED Diagnoses   Final diagnoses:  None    New Prescriptions New Prescriptions   No medications on file     Lorre Nick, MD 04/29/16 1534

## 2017-02-08 ENCOUNTER — Ambulatory Visit: Payer: 59 | Admitting: Obstetrics and Gynecology

## 2017-03-06 ENCOUNTER — Ambulatory Visit: Payer: 59 | Admitting: Obstetrics and Gynecology

## 2017-04-13 ENCOUNTER — Encounter: Payer: Self-pay | Admitting: Family Medicine

## 2017-04-13 ENCOUNTER — Ambulatory Visit (INDEPENDENT_AMBULATORY_CARE_PROVIDER_SITE_OTHER): Payer: 59 | Admitting: Family Medicine

## 2017-04-13 VITALS — BP 90/60 | HR 87 | Temp 98.4°F | Ht 60.5 in | Wt 124.0 lb

## 2017-04-13 DIAGNOSIS — Z0000 Encounter for general adult medical examination without abnormal findings: Secondary | ICD-10-CM

## 2017-04-13 DIAGNOSIS — Z13220 Encounter for screening for lipoid disorders: Secondary | ICD-10-CM

## 2017-04-13 DIAGNOSIS — Z8711 Personal history of peptic ulcer disease: Secondary | ICD-10-CM | POA: Diagnosis not present

## 2017-04-13 LAB — COMPREHENSIVE METABOLIC PANEL
AG Ratio: 1.5 (calc) (ref 1.0–2.5)
ALT: 16 U/L (ref 6–29)
AST: 18 U/L (ref 10–30)
Albumin: 4.4 g/dL (ref 3.6–5.1)
Alkaline phosphatase (APISO): 74 U/L (ref 33–115)
BILIRUBIN TOTAL: 0.7 mg/dL (ref 0.2–1.2)
BUN: 7 mg/dL (ref 7–25)
CO2: 28 mmol/L (ref 20–32)
Calcium: 9.5 mg/dL (ref 8.6–10.2)
Chloride: 101 mmol/L (ref 98–110)
Creat: 0.57 mg/dL (ref 0.50–1.10)
Globulin: 2.9 g/dL (calc) (ref 1.9–3.7)
Glucose, Bld: 86 mg/dL (ref 65–99)
Potassium: 4.6 mmol/L (ref 3.5–5.3)
SODIUM: 138 mmol/L (ref 135–146)
TOTAL PROTEIN: 7.3 g/dL (ref 6.1–8.1)

## 2017-04-13 LAB — LIPID PANEL
CHOLESTEROL: 227 mg/dL — AB (ref <200)
HDL: 60 mg/dL (ref >50)
LDL Cholesterol (Calc): 144 mg/dL (calc) — ABNORMAL HIGH
Non-HDL Cholesterol (Calc): 167 mg/dL (calc) — ABNORMAL HIGH (ref <130)
Total CHOL/HDL Ratio: 3.8 (calc) (ref <5.0)
Triglycerides: 110 mg/dL (ref <150)

## 2017-04-13 NOTE — Patient Instructions (Signed)
Please stop at the lab to have labs drawn.  

## 2017-04-13 NOTE — Progress Notes (Signed)
   Subjective:    Patient ID: Felicia Mccoy, female    DOB: 02-16-1976, 41 y.o.   MRN: [id number]  HPI  41 year old female pt presents to re-establish care.  She is doing well overall and is do for wellness visit.   GYN: last OV last year.. Has upcoming appt  10/10. Dr. Oscar La.   Diet: fruit and veggies   Exercise: daily, 30 min walking treadmill   She works as Systems analyst.  Social History /Family History/Past Medical History reviewed in detail and updated in EMR if needed. Blood pressure 90/60, pulse 87, temperature 98.4 F (36.9 C), temperature source Oral, height 5' 0.5" (1.537 m), weight 124 lb (56.2 kg), last menstrual period 04/06/2017.   Review of Systems  Constitutional: Negative for fatigue and fever.  HENT: Negative for congestion.   Eyes: Negative for pain.  Respiratory: Negative for cough and shortness of breath.   Cardiovascular: Negative for chest pain, palpitations and leg swelling.  Gastrointestinal: Negative for abdominal pain.  Genitourinary: Negative for dysuria and vaginal bleeding.  Musculoskeletal: Negative for back pain.  Neurological: Negative for syncope, light-headedness and headaches.  Psychiatric/Behavioral: Negative for dysphoric mood.       Objective:   Physical Exam  Constitutional: Vital signs are normal. She appears well-developed and well-nourished. She is cooperative.  Non-toxic appearance. She does not appear ill. No distress.  HENT:  Head: Normocephalic.  Right Ear: Hearing, tympanic membrane, external ear and ear canal normal.  Left Ear: Hearing, tympanic membrane, external ear and ear canal normal.  Nose: Nose normal.  Eyes: Pupils are equal, round, and reactive to light. Conjunctivae, EOM and lids are normal. Lids are everted and swept, no foreign bodies found.  Neck: Trachea normal and normal range of motion. Neck supple. Carotid bruit is not present. No thyroid mass and no thyromegaly present.  Cardiovascular: Normal  rate, regular rhythm, S1 normal, S2 normal, normal heart sounds and intact distal pulses.  Exam reveals no gallop.   No murmur heard. Pulmonary/Chest: Effort normal and breath sounds normal. No respiratory distress. She has no wheezes. She has no rhonchi. She has no rales.  Abdominal: Soft. Normal appearance and bowel sounds are normal. She exhibits no distension, no fluid wave, no abdominal bruit and no mass. There is no hepatosplenomegaly. There is no tenderness. There is no rebound, no guarding and no CVA tenderness. No hernia.  Lymphadenopathy:    She has no cervical adenopathy.    She has no axillary adenopathy.  Neurological: She is alert. She has normal strength. No cranial nerve deficit or sensory deficit.  Skin: Skin is warm, dry and intact. No rash noted.  Psychiatric: Her speech is normal and behavior is normal. Judgment normal. Her mood appears not anxious. Cognition and memory are normal. She does not exhibit a depressed mood.          Assessment & Plan:  The patient's preventative maintenance and recommended screening tests for an annual wellness exam were reviewed in full today. Brought up to date unless services declined.  Counselled on the importance of diet, exercise, and its role in overall health and mortality. The patient's FH and SH was reviewed, including their home life, tobacco status, and drug and alcohol status.   Vaccines: refused flu and td vaccine Pap/DVE:  GYN Mammo:  Per gyn Colon: no early family history Smoking Status:none ETOH/ drug use: none  HIV screen:   refused.

## 2017-04-13 NOTE — Assessment & Plan Note (Signed)
Resolved Hpylori , GERD and PUD. No longer on PPI.

## 2017-04-13 NOTE — Addendum Note (Signed)
Addended by: Felix Ahmadi A on: 04/13/2017 04:26 PM   Modules accepted: Orders

## 2017-04-13 NOTE — Progress Notes (Deleted)
The patient is here for annual wellness exam and preventative care.    

## 2017-04-16 ENCOUNTER — Encounter: Payer: Self-pay | Admitting: *Deleted

## 2017-04-19 ENCOUNTER — Ambulatory Visit (INDEPENDENT_AMBULATORY_CARE_PROVIDER_SITE_OTHER): Payer: 59 | Admitting: Obstetrics and Gynecology

## 2017-04-19 ENCOUNTER — Encounter: Payer: Self-pay | Admitting: Obstetrics and Gynecology

## 2017-04-19 VITALS — BP 92/60 | HR 88 | Resp 16 | Ht 60.75 in | Wt 123.0 lb

## 2017-04-19 DIAGNOSIS — Z01419 Encounter for gynecological examination (general) (routine) without abnormal findings: Secondary | ICD-10-CM | POA: Diagnosis not present

## 2017-04-19 NOTE — Progress Notes (Signed)
41 y.o. G1P1001 MarriedAsianF here for annual exam.   Menses q month x 2-3 days. She changes her pad 2 x a day and 1 x at night. No cramps. Using condoms. No dyspareunia.      Patient's last menstrual period was 04/06/2017.          Sexually active: Yes.    The current method of family planning is condoms everytime.    Exercising: Yes.    walking/swimming Smoker:  no  Health Maintenance: Pap:  02/02/16 Neg. HR HPV:neg  History of abnormal Pap:  no MMG:  Never TDaP:  2008, she declines   reports that she has never smoked. She has never used smokeless tobacco. She reports that she does not drink alcohol or use drugs. She is a [employer] manager, has to travel to work. Son is 5, will go to kindergarten next year.   Past Medical History:  Diagnosis Date  . Bell's palsy complicating pregnancy    L facial weakness. Present for a week  . GERD (gastroesophageal reflux disease)   . Mental disorder   . No pertinent past medical history   . Panic attacks   . Ulcer     Past Surgical History:  Procedure Laterality Date  . CESAREAN SECTION  05/13/2011   Procedure: CESAREAN SECTION;  Surgeon: Robley Fries;  Location: [facility];  Service: Gynecology;  Laterality: N/A;  . NO PAST SURGERIES      No current outpatient prescriptions on file.   No current facility-administered medications for this visit.     History reviewed. No pertinent family history.  Review of Systems  Constitutional: Negative.   HENT: Negative.   Eyes: Negative.   Respiratory: Negative.   Cardiovascular: Negative.   Gastrointestinal: Negative.   Endocrine: Negative.   Genitourinary: Negative.   Musculoskeletal: Negative.   Skin: Negative.   Allergic/Immunologic: Negative.   Neurological: Negative.   Hematological: Negative.   Psychiatric/Behavioral: Negative.     Exam:   BP 92/60 (BP Location: Right Arm, Patient Position: Sitting, Cuff Size: Normal)   Pulse 88   Resp 16   Ht 5' 0.75" (1.543 m)   Wt  123 lb (55.8 kg)   LMP 04/06/2017   BMI 23.43 kg/m   Weight change: @ Height:   Height: 5' 0.75" (154.3 cm)  Ht Readings from Last 3 Encounters:  04/19/17 5' 0.75" (1.543 m)  04/13/17 5' 0.5" (1.537 m)  04/29/16 5' 1.5" (1.562 m)    General appearance: alert, cooperative and appears stated age Head: Normocephalic, without obvious abnormality, atraumatic Neck: no adenopathy, supple, symmetrical, trachea midline and thyroid normal to inspection and palpation Lungs: clear to auscultation bilaterally Cardiovascular: regular rate and rhythm Breasts: normal appearance, no masses or tenderness Abdomen: soft, non-tender; non distended,  no masses,  no organomegaly Extremities: extremities normal, atraumatic, no cyanosis or edema Skin: Skin color, texture, turgor normal. No rashes or lesions Lymph nodes: Cervical, supraclavicular, and axillary nodes normal. No abnormal inguinal nodes palpated Neurologic: Grossly normal   Pelvic: External genitalia:  no lesions              Urethra:  normal appearing urethra with no masses, tenderness or lesions              Bartholins and Skenes: 26normal                 Vagina: normal appearing vagina with normal color and discharge, no lesions  Cervix: no lesions               Bimanual Exam:  Uterus:  normal size, contour, position, consistency, mobility, non-tender              Adnexa: no mass, fullness, tenderness               Rectovaginal: Confirms               Anus:  normal sphincter tone, no lesions  Chaperone was present for exam.  A:  Well Woman with normal exam  P:   No pap this year  Labs with primary MD  Declines TDAP  Condoms for contraception  Mammogram after her birthday  Discussed breast self exam  Discussed calcium and vit D intake

## 2017-04-19 NOTE — Patient Instructions (Signed)

## 2017-09-11 ENCOUNTER — Encounter: Payer: Self-pay | Admitting: Obstetrics and Gynecology

## 2017-09-11 ENCOUNTER — Ambulatory Visit (INDEPENDENT_AMBULATORY_CARE_PROVIDER_SITE_OTHER): Payer: 59 | Admitting: Obstetrics and Gynecology

## 2017-09-11 ENCOUNTER — Other Ambulatory Visit: Payer: Self-pay

## 2017-09-11 VITALS — BP 116/78 | HR 80 | Resp 14 | Wt 124.0 lb

## 2017-09-11 DIAGNOSIS — Z3169 Encounter for other general counseling and advice on procreation: Secondary | ICD-10-CM

## 2017-09-11 NOTE — Progress Notes (Signed)
GYNECOLOGY  VISIT   HPI: 42 y.o.   Married  Asian  female   G1P1001 with Patient's last menstrual period was 08/20/2017.   here for consult to discuss getting pregnant. Her son is 6. She had Bell's Palsy when she was pregnant. She had elevated blood pressure at 37 weeks, then normalized. She had a C/S at 38 weeks for PROM and a failed induction. Currently using condoms.   GYNECOLOGIC HISTORY: Patient's last menstrual period was 08/20/2017. Contraception: condoms (every time)  Menopausal hormone therapy: none         OB History    Gravida Para Term Preterm AB Living   1 1 1     1    SAB TAB Ectopic Multiple Live Births           1         Patient Active Problem List   Diagnosis Date Noted  . History of peptic ulcer disease 12/20/2007    Past Medical History:  Diagnosis Date  . Bell's palsy complicating pregnancy    L facial weakness. Present for a week  . GERD (gastroesophageal reflux disease)   . Mental disorder   . No pertinent past medical history   . Panic attacks   . Ulcer     Past Surgical History:  Procedure Laterality Date  . CESAREAN SECTION  05/13/2011   Procedure: CESAREAN SECTION;  Surgeon: Robley FriesVaishali R Mody;  Location: [facility];  Service: Gynecology;  Laterality: N/A;  . NO PAST SURGERIES      No current outpatient medications on file.   No current facility-administered medications for this visit.      ALLERGIES: Nubain [nalbuphine hcl] and Stadol [butorphanol tartrate]  History reviewed. No pertinent family history.  Social History   Socioeconomic History  . Marital status: Married    Spouse name: Not on file  . Number of children: Not on file  . Years of education: Not on file  . Highest education level: Not on file  Social Needs  . Financial resource strain: Not on file  . Food insecurity - worry: Not on file  . Food insecurity - inability: Not on file  . Transportation needs - medical: Not on file  . Transportation needs - non-medical: Not  on file  Occupational History  . Not on file  Tobacco Use  . Smoking status: Never Smoker  . Smokeless tobacco: Never Used  Substance and Sexual Activity  . Alcohol use: No  . Drug use: No  . Sexual activity: Yes    Partners: Male    Birth control/protection: Condom  Other Topics Concern  . Not on file  Social History Narrative   Regular exercise--yes, 2 times a week running, lifts weight   Diet: Fruit and veggies,water    Review of Systems  Constitutional: Negative.   HENT: Negative.   Eyes: Negative.   Respiratory: Negative.   Cardiovascular: Negative.   Gastrointestinal: Negative.   Genitourinary: Negative.   Musculoskeletal: Negative.   Skin: Negative.   Neurological: Negative.   Endo/Heme/Allergies: Negative.   Psychiatric/Behavioral: Negative.     PHYSICAL EXAMINATION:    BP 116/78 (BP Location: Right Arm, Patient Position: Sitting, Cuff Size: Normal)   Pulse 80   Resp 14   Wt 124 lb (56.2 kg)   LMP 08/20/2017   BMI 23.62 kg/m     General appearance: alert, cooperative and appears stated age  ASSESSMENT Interested in possible pregnancy, would not pursue a fertility evaluation if she didn't  get pregnant on her own.     PLAN Discussed the increased risk of infertility, SAB, chromosomal abnormalities, and medical issues (like diabetes and HTN) Start prenatal vitamins Given a handout on the risks of chromosomal abnormalities and a handout from [facility] as to options for testing ACOG handout on preparing for pregnancy given Questions all answered Names of OB's given Call with any questions or concerns   An After Visit Summary was printed and given to the patient.  ~15 minutes face to face time of which over 50% was spent in counseling.

## 2017-09-11 NOTE — Patient Instructions (Signed)
Preparing for Pregnancy If you are considering becoming pregnant, make an appointment to see your regular health care provider to learn how to prepare for a safe and healthy pregnancy (preconception care). During a preconception care visit, your health care provider will:  Do a complete physical exam, including a Pap test.  Take a complete medical history.  Give you information, answer your questions, and help you resolve problems.  Preconception checklist Medical history  Tell your health care provider about any current or past medical conditions. Your pregnancy or your ability to become pregnant may be affected by chronic conditions, such as diabetes, chronic hypertension, and thyroid problems.  Include your family's medical history as well as your partner's medical history.  Tell your health care provider about any history of STIs (sexually transmitted infections).These can affect your pregnancy. In some cases, they can be passed to your baby. Discuss any concerns that you have about STIs.  If indicated, discuss the benefits of genetic testing. This testing will show whether there are any genetic conditions that may be passed from you or your partner to your baby.  Tell your health care provider about: ? Any problems you have had with conception or pregnancy. ? Any medicines you take. These include vitamins, herbal supplements, and over-the-counter medicines. ? Your history of immunizations. Discuss any vaccinations that you may need.  Diet  Ask your health care provider what to include in a healthy diet that has a balance of nutrients. This is especially important when you are pregnant or preparing to become pregnant.  Ask your health care provider to help you reach a healthy weight before pregnancy. ? If you are overweight, you may be at higher risk for certain complications, such as high blood pressure, diabetes, and preterm birth. ? If you are underweight, you are more likely  to have a baby who has a low birth weight.  Lifestyle, work, and home  Let your health care provider know: ? About any lifestyle habits that you have, such as alcohol use, drug use, or smoking. ? About recreational activities that may put you at risk during pregnancy, such as downhill skiing and certain exercise programs. ? Tell your health care provider about any international travel, especially any travel to places with an active Zika virus outbreak. ? About harmful substances that you may be exposed to at work or at home. These include chemicals, pesticides, radiation, or even litter boxes. ? If you do not feel safe at home.  Mental health  Tell your health care provider about: ? Any history of mental health conditions, including feelings of depression, sadness, or anxiety. ? Any medicines that you take for a mental health condition. These include herbs and supplements.  Home instructions to prepare for pregnancy Lifestyle  Eat a balanced diet. This includes fresh fruits and vegetables, whole grains, lean meats, low-fat dairy products, healthy fats, and foods that are high in fiber. Ask to meet with a nutritionist or registered dietitian for assistance with meal planning and goals.  Get regular exercise. Try to be active for at least 30 minutes a day on most days of the week. Ask your health care provider which activities are safe during pregnancy.  Do not use any products that contain nicotine or tobacco, such as cigarettes and e-cigarettes. If you need help quitting, ask your health care provider.  Do not drink alcohol.  Do not take illegal drugs.  Maintain a healthy weight. Ask your health care provider what weight range is   right for you.  General instructions  Keep an accurate record of your menstrual periods. This makes it easier for your health care provider to determine your baby's due date.  Begin taking prenatal vitamins and folic acid supplements daily as directed by  your health care provider.  Manage any chronic conditions, such as high blood pressure and diabetes, as told by your health care provider. This is important.  How do I know that I am pregnant? You may be pregnant if you have been sexually active and you miss your period. Symptoms of early pregnancy include:  Mild cramping.  Very light vaginal bleeding (spotting).  Feeling unusually tired.  Nausea and vomiting (morning sickness).  If you have any of these symptoms and you suspect that you might be pregnant, you can take a home pregnancy test. These tests check for a hormone in your urine (human chorionic gonadotropin, or hCG). A woman's body begins to make this hormone during early pregnancy. These tests are very accurate. Wait until at least the first day after you miss your period to take one. If the test shows that you are pregnant (you get a positive result), call your health care provider to make an appointment for prenatal care. What should I do if I become pregnant?  Make an appointment with your health care provider as soon as you suspect you are pregnant.  Do not use any products that contain nicotine, such as cigarettes, chewing tobacco, and e-cigarettes. If you need help quitting, ask your health care provider.  Do not drink alcoholic beverages. Alcohol is related to a number of birth defects.  Avoid toxic odors and chemicals.  You may continue to have sexual intercourse if it does not cause pain or other problems, such as vaginal bleeding. This information is not intended to replace advice given to you by your health care provider. Make sure you discuss any questions you have with your health care provider. Document Released: 06/08/2008 Document Revised: 02/22/2016 Document Reviewed: 01/16/2016 Elsevier Interactive Patient Education  2018 [employer]  

## 2018-02-27 ENCOUNTER — Encounter: Payer: Self-pay | Admitting: Emergency Medicine

## 2018-02-27 ENCOUNTER — Emergency Department
Admission: EM | Admit: 2018-02-27 | Discharge: 2018-02-27 | Disposition: A | Discharge status: Home / Self Care | Payer: 59 | Attending: Emergency Medicine | Admitting: Emergency Medicine

## 2018-02-27 ENCOUNTER — Emergency Department: Payer: 59

## 2018-02-27 DIAGNOSIS — S298XXA Other specified injuries of thorax, initial encounter: Secondary | ICD-10-CM | POA: Insufficient documentation

## 2018-02-27 DIAGNOSIS — S60811A Abrasion of right wrist, initial encounter: Secondary | ICD-10-CM | POA: Diagnosis not present

## 2018-02-27 DIAGNOSIS — S0993XA Unspecified injury of face, initial encounter: Secondary | ICD-10-CM | POA: Diagnosis present

## 2018-02-27 DIAGNOSIS — Y9389 Activity, other specified: Secondary | ICD-10-CM | POA: Diagnosis not present

## 2018-02-27 DIAGNOSIS — R0789 Other chest pain: Secondary | ICD-10-CM

## 2018-02-27 DIAGNOSIS — S161XXA Strain of muscle, fascia and tendon at neck level, initial encounter: Secondary | ICD-10-CM | POA: Insufficient documentation

## 2018-02-27 DIAGNOSIS — S8002XA Contusion of left knee, initial encounter: Secondary | ICD-10-CM | POA: Diagnosis not present

## 2018-02-27 DIAGNOSIS — Y92410 Unspecified street and highway as the place of occurrence of the external cause: Secondary | ICD-10-CM | POA: Insufficient documentation

## 2018-02-27 DIAGNOSIS — S8001XA Contusion of right knee, initial encounter: Secondary | ICD-10-CM | POA: Diagnosis not present

## 2018-02-27 DIAGNOSIS — Y998 Other external cause status: Secondary | ICD-10-CM | POA: Diagnosis not present

## 2018-02-27 MED ORDER — NAPROXEN 500 MG PO TABS: 500.0000 mg | ORAL_TABLET | Freq: Two times a day (BID) | ORAL | 0 refills | Status: DC
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
## 2018-02-27 NOTE — Discharge Instructions (Signed)
Follow-up with your primary care provider if any continued problems.  You will be sore and stiff for approximately 4 to 5 days.  Begin taking naproxen 500 mg twice daily with food.  Moist heat or ice to your muscles as needed for discomfort.
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
## 2018-05-01 ENCOUNTER — Ambulatory Visit: Payer: 59 | Admitting: Obstetrics and Gynecology

## 2018-06-20 ENCOUNTER — Ambulatory Visit: Payer: 59 | Admitting: Obstetrics and Gynecology
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
## 2018-06-21 ENCOUNTER — Other Ambulatory Visit: Payer: Self-pay

## 2018-06-21 ENCOUNTER — Encounter: Payer: Self-pay | Admitting: Obstetrics and Gynecology

## 2018-06-21 ENCOUNTER — Ambulatory Visit (INDEPENDENT_AMBULATORY_CARE_PROVIDER_SITE_OTHER): Payer: 59 | Admitting: Obstetrics and Gynecology

## 2018-06-21 VITALS — BP 100/54 | HR 76 | Resp 14 | Ht 60.5 in | Wt 124.0 lb

## 2018-06-21 DIAGNOSIS — Z01419 Encounter for gynecological examination (general) (routine) without abnormal findings: Secondary | ICD-10-CM

## 2018-06-21 DIAGNOSIS — Z0000 Encounter for general adult medical examination without abnormal findings: Secondary | ICD-10-CM

## 2018-06-21 NOTE — Patient Instructions (Addendum)
EXERCISE AND DIET:  We recommended that you start or continue a regular exercise program for good health. Regular exercise means any activity that makes your heart beat faster and makes you sweat.  We recommend exercising at least 30 minutes per day at least 3 days a week, preferably 4 or 5.  We also recommend a diet low in fat and sugar.  Inactivity, poor dietary choices and obesity can cause diabetes, heart attack, stroke, and kidney damage, among others.    ALCOHOL AND SMOKING:  Women should limit their alcohol intake to no more than 7 drinks/beers/glasses of wine (combined, not each!) per week. Moderation of alcohol intake to this level decreases your risk of breast cancer and liver damage. And of course, no recreational drugs are part of a healthy lifestyle.  And absolutely no smoking or even second hand smoke. Most people know smoking can cause heart and lung diseases, but did you know it also contributes to weakening of your bones? Aging of your skin?  Yellowing of your teeth and nails?  CALCIUM AND VITAMIN D:  Adequate intake of calcium and Vitamin D are recommended.  The recommendations for exact amounts of these supplements seem to change often, but generally speaking 600 mg of calcium (either carbonate or citrate) and 800 units of Vitamin D per day seems prudent. Certain women may benefit from higher intake of Vitamin D.  If you are among these women, your doctor will have told you during your visit.    PAP SMEARS:  Pap smears, to check for cervical cancer or precancers,  have traditionally been done yearly, although recent scientific advances have shown that most women can have pap smears less often.  However, every woman still should have a physical exam from her gynecologist every year. It will include a breast check, inspection of the vulva and vagina to check for abnormal growths or skin changes, a visual exam of the cervix, and then an exam to evaluate the size and shape of the uterus and  ovaries.  And after 40 years of age, a rectal exam is indicated to check for rectal cancers. We will also provide age appropriate advice regarding health maintenance, like when you should have certain vaccines, screening for sexually transmitted diseases, bone density testing, colonoscopy, mammograms, etc.   MAMMOGRAMS:  All women over 40 years old should have a yearly mammogram. Many facilities now offer a "3D" mammogram, which may cost around $50 extra out of pocket. If possible,  we recommend you accept the option to have the 3D mammogram performed.  It both reduces the number of women who will be called back for extra views which then turn out to be normal, and it is better than the routine mammogram at detecting truly abnormal areas.    COLONOSCOPY:  Colonoscopy to screen for colon cancer is recommended for all women at age 50.  We know, you hate the idea of the prep.  We agree, BUT, having colon cancer and not knowing it is worse!!  Colon cancer so often starts as a polyp that can be seen and removed at colonscopy, which can quite literally save your life!  And if your first colonoscopy is normal and you have no family history of colon cancer, most women don't have to have it again for 10 years.  Once every ten years, you can do something that may end up saving your life, right?  We will be happy to help you get it scheduled when you are ready.    Be sure to check your insurance coverage so you understand how much it will cost.  It may be covered as a preventative service at no cost, but you should check your particular policy.      Breast Self-Awareness Breast self-awareness means being familiar with how your breasts look and feel. It involves checking your breasts regularly and reporting any changes to your health care provider. Practicing breast self-awareness is important. A change in your breasts can be a sign of a serious medical problem. Being familiar with how your breasts look and feel allows  you to find any problems early, when treatment is more likely to be successful. All women should practice breast self-awareness, including women who have had breast implants. How to do a breast self-exam One way to learn what is normal for your breasts and whether your breasts are changing is to do a breast self-exam. To do a breast self-exam: Look for Changes  1. Remove all the clothing above your waist. 2. Stand in front of a mirror in a room with good lighting. 3. Put your hands on your hips. 4. Push your hands firmly downward. 5. Compare your breasts in the mirror. Look for differences between them (asymmetry), such as: ? Differences in shape. ? Differences in size. ? Puckers, dips, and bumps in one breast and not the other. 6. Look at each breast for changes in your skin, such as: ? Redness. ? Scaly areas. 7. Look for changes in your nipples, such as: ? Discharge. ? Bleeding. ? Dimpling. ? Redness. ? A change in position. Feel for Changes  Carefully feel your breasts for lumps and changes. It is best to do this while lying on your back on the floor and again while sitting or standing in the shower or tub with soapy water on your skin. Feel each breast in the following way:  Place the arm on the side of the breast you are examining above your head.  Feel your breast with the other hand.  Start in the nipple area and make  inch (2 cm) overlapping circles to feel your breast. Use the pads of your three middle fingers to do this. Apply light pressure, then medium pressure, then firm pressure. The light pressure will allow you to feel the tissue closest to the skin. The medium pressure will allow you to feel the tissue that is a little deeper. The firm pressure will allow you to feel the tissue close to the ribs.  Continue the overlapping circles, moving downward over the breast until you feel your ribs below your breast.  Move one finger-width toward the center of the body.  Continue to use the  inch (2 cm) overlapping circles to feel your breast as you move slowly up toward your collarbone.  Continue the up and down exam using all three pressures until you reach your armpit.  Write Down What You Find  Write down what is normal for each breast and any changes that you find. Keep a written record with breast changes or normal findings for each breast. By writing this information down, you do not need to depend only on memory for size, tenderness, or location. Write down where you are in your menstrual cycle, if you are still menstruating. If you are having trouble noticing differences in your breasts, do not get discouraged. With time you will become more familiar with the variations in your breasts and more comfortable with the exam. How often should I examine my breasts? Examine   your breasts every month. If you are breastfeeding, the best time to examine your breasts is after a feeding or after using a breast pump. If you menstruate, the best time to examine your breasts is 5-7 days after your period is over. During your period, your breasts are lumpier, and it may be more difficult to notice changes. When should I see my health care provider? See your health care provider if you notice:  A change in shape or size of your breasts or nipples.  A change in the skin of your breast or nipples, such as a reddened or scaly area.  Unusual discharge from your nipples.  A lump or thick area that was not there before.  Pain in your breasts.  Anything that concerns you.  This information is not intended to replace advice given to you by your health care provider. Make sure you discuss any questions you have with your health care provider. Document Released: 06/26/2005 Document Revised: 12/02/2015 Document Reviewed: 05/16/2015 Elsevier Interactive Patient Education  2018 Elsevier Inc[employer]   Mammogram Facilities  Yearly screening mammograms are recommended for women  beginning at age 40. For a routine screening mammogram, you may schedule the appointment and have it done at the location of your choice.  Please ask the facility to send the results to our office. (fax [phone]) Location options include:  [facility] [address], [address] [place], [place] [address] [phone]  [facility] [address], [address] [place], [place] [address] [phone]  

## 2018-12-10 ENCOUNTER — Ambulatory Visit: Payer: 59 | Admitting: Obstetrics and Gynecology

## 2018-12-10 ENCOUNTER — Ambulatory Visit (INDEPENDENT_AMBULATORY_CARE_PROVIDER_SITE_OTHER): Payer: 59

## 2018-12-10 ENCOUNTER — Telehealth: Payer: Self-pay | Admitting: Obstetrics and Gynecology

## 2018-12-10 ENCOUNTER — Other Ambulatory Visit: Payer: Self-pay

## 2018-12-10 ENCOUNTER — Encounter: Payer: Self-pay | Admitting: Obstetrics and Gynecology

## 2018-12-10 VITALS — BP 100/68 | HR 80 | Temp 97.9°F | Wt 128.0 lb

## 2018-12-10 DIAGNOSIS — R102 Pelvic and perineal pain: Secondary | ICD-10-CM | POA: Diagnosis not present

## 2018-12-10 DIAGNOSIS — N949 Unspecified condition associated with female genital organs and menstrual cycle: Secondary | ICD-10-CM | POA: Diagnosis not present

## 2018-12-10 DIAGNOSIS — R140 Abdominal distension (gaseous): Secondary | ICD-10-CM

## 2018-12-10 DIAGNOSIS — Z0000 Encounter for general adult medical examination without abnormal findings: Secondary | ICD-10-CM | POA: Diagnosis not present

## 2018-12-10 LAB — POCT URINE PREGNANCY: Preg Test, Ur: NEGATIVE

## 2018-12-10 MED ORDER — DOXYCYCLINE HYCLATE 100 MG PO CAPS: 100.0000 mg | ORAL_CAPSULE | Freq: Two times a day (BID) | ORAL | 0 refills | Status: DC

## 2018-12-10 NOTE — Progress Notes (Signed)
GYNECOLOGY  VISIT   HPI: 43 y.o.   Married Other or two or more races Not Hispanic or Latino  female   G5P1001 with Patient's last menstrual period was 12/01/2018 (exact date).   here for evaluation of pelvic pain.  Wants labs done.  She c/o intermittent deep dyspareunia (infrequent). Over the last 2 months she has "gas pains" in her vagina, feels bloated, low pelvic discomfort. The symptoms last 3 days. She does pass increased gas from her rectum. Even has shoulder and back pain.  Her arm has been feeling numb. No weakness.   Menses q month x 3 days. Changes her pad in 3-4 hours (not full). No intermenstrual bleeding. Moderate cramps (no change).   No fevers, diarrhea, constipation, or urinary c/o. Uses condoms all the time. She has changed her diet, avoiding red meat in the last 6 months. She eats more fruit and vegetables in the last 6 months. Having sex every 1-2 weeks.     GYNECOLOGIC HISTORY: Patient's last menstrual period was 12/01/2018 (exact date). Contraception:Condoms Menopausal hormone therapy: None        OB History    Gravida  1   Para  1   Term  1   Preterm      AB      Living  1     SAB      TAB      Ectopic      Multiple      Live Births  1              Patient Active Problem List   Diagnosis Date Noted  . History of peptic ulcer disease 12/20/2007    Past Medical History:  Diagnosis Date  . Bell's palsy complicating pregnancy    L facial weakness. Present for a week  . GERD (gastroesophageal reflux disease)   . Mental disorder   . No pertinent past medical history   . Panic attacks   . Ulcer     Past Surgical History:  Procedure Laterality Date  . CESAREAN SECTION  05/13/2011   Procedure: CESAREAN SECTION;  Surgeon: Robley Fries;  Location: [facility];  Service: Gynecology;  Laterality: N/A;  . NO PAST SURGERIES      No current outpatient medications on file.   No current facility-administered medications for this visit.     ALLERGIES: Nubain [nalbuphine hcl] and Stadol [butorphanol tartrate]  History reviewed. No pertinent family history.  Social History   Socioeconomic History  . Marital status: Married    Spouse name: Not on file  . Number of children: Not on file  . Years of education: Not on file  . Highest education level: Not on file  Occupational History  . Not on file  Social Needs  . Financial resource strain: Not on file  . Food insecurity:    Worry: Not on file    Inability: Not on file  . Transportation needs:    Medical: Not on file    Non-medical: Not on file  Tobacco Use  . Smoking status: Never Smoker  . Smokeless tobacco: Never Used  Substance and Sexual Activity  . Alcohol use: No  . Drug use: No  . Sexual activity: Yes    Partners: Male    Birth control/protection: Condom  Lifestyle  . Physical activity:    Days per week: Not on file    Minutes per session: Not on file  . Stress: Not on file  Relationships  .  Social connections:    Talks on phone: Not on file    Gets together: Not on file    Attends religious service: Not on file    Active member of club or organization: Not on file    Attends meetings of clubs or organizations: Not on file    Relationship status: Not on file  . Intimate partner violence:    Fear of current or ex partner: Not on file    Emotionally abused: Not on file    Physically abused: Not on file    Forced sexual activity: Not on file  Other Topics Concern  . Not on file  Social History Narrative   Regular exercise--yes, 2 times a week running, lifts weight   Diet: Fruit and veggies,water    Review of Systems  Constitutional: Negative.   HENT: Negative.   Eyes: Negative.   Respiratory: Negative.   Cardiovascular: Negative.   Gastrointestinal:       Bloating Increase flatulence  Genitourinary: Negative.   Musculoskeletal: Negative.   Skin: Negative.   Neurological: Negative.   Endo/Heme/Allergies: Negative.    Psychiatric/Behavioral: Negative.     PHYSICAL EXAMINATION:    BP 100/68 (BP Location: Right Arm, Patient Position: Sitting, Cuff Size: Normal)   Pulse 80   Temp 97.9 F (36.6 C) (Skin)   Wt 128 lb (58.1 kg)   LMP 12/01/2018 (Exact Date)   BMI 24.59 kg/m     General appearance: alert, cooperative and appears stated age Abdomen: soft, non-tender; non distended, no masses,  no organomegaly  Pelvic: External genitalia:  no lesions              Urethra:  normal appearing urethra with no masses, tenderness or lesions              Bartholins and Skenes: normal                 Vagina: normal appearing vagina with normal color and discharge, no lesions              Cervix: no cervical motion tenderness and no lesions              Bimanual Exam:  Uterus:  retroverted, mobile, normal sized, tender              Adnexa: no masses, slightly tender on the right              Rectovaginal: Yes.  .  Confirms.              Anus:  normal sphincter tone, no lesions  Chaperone was present for exam.  Ultrasound images reviewed with the patient. Normal adnexa, possible endometrial polyps (under 1.5 cm)  ASSESSMENT Pelvic pain, bloating after intercourse Ultrasound concerning for possible small endometrial polyps (no symptoms), no risks for endometrial cancer. Will further evaluate with symptoms Uterine tenderness on exam, concerning for possible endometritis Shoulder pain, numbness in her arm Overdue for screening labs (got cancelled with covid 19), requests they be done now.      PLAN UPT negative Genprobe CBC with diff Trial of doxycycline for possible endometritis F/U in 2 weeks Screening labs done F/U with primary for shoulder pain and arm numbness    An After Visit Summary was printed and given to the patient.  ~25 minutes face to face time of which over 50% was spent in counseling.

## 2018-12-10 NOTE — Telephone Encounter (Signed)
Spoke with patient. Patient reports for the past month she has been experiencing bloating, fullness and pain in pelvis for 3 days after intercourse. Experiences dizziness when standing.   Last had intercourse on 5/30, pain currently 7/10. LMP 12/04/18. Normal BMs, last BM 6/2. Denies vaginal bleeding, N/V, fever/chills. Covid 19 precautions reviewed, Covid19 prescreen negative.  PUS at 1pm, consult at 1:30pm with Dr. Oscar La. Patient verbalizes understanding and is agreeable.   Order placed for PUS, message to business office.   Routing to provider for final review. Patient is agreeable to disposition. Will close encounter.  Cc: Harland Dingwall

## 2018-12-10 NOTE — Telephone Encounter (Signed)
Patient stated that she has been feeling pain and "gassy" after having sex. Patient stated that this has been going on for about 2 months.

## 2018-12-11 LAB — COMPREHENSIVE METABOLIC PANEL
ALT: 16 IU/L (ref 0–32)
AST: 21 IU/L (ref 0–40)
Albumin/Globulin Ratio: 1.8 (ref 1.2–2.2)
Albumin: 4.7 g/dL (ref 3.8–4.8)
Alkaline Phosphatase: 57 IU/L (ref 39–117)
BUN/Creatinine Ratio: 13 (ref 9–23)
BUN: 7 mg/dL (ref 6–24)
Bilirubin Total: 0.5 mg/dL (ref 0.0–1.2)
CO2: 25 mmol/L (ref 20–29)
Calcium: 9.2 mg/dL (ref 8.7–10.2)
Chloride: 100 mmol/L (ref 96–106)
Creatinine, Ser: 0.52 mg/dL — ABNORMAL LOW (ref 0.57–1.00)
GFR calc Af Amer: 137 mL/min/{1.73_m2} (ref >59)
GFR calc non Af Amer: 119 mL/min/{1.73_m2} (ref >59)
Globulin, Total: 2.6 g/dL (ref 1.5–4.5)
Glucose: 95 mg/dL (ref 65–99)
Potassium: 3.9 mmol/L (ref 3.5–5.2)
Sodium: 140 mmol/L (ref 134–144)
Total Protein: 7.3 g/dL (ref 6.0–8.5)

## 2018-12-11 LAB — CBC WITH DIFFERENTIAL/PLATELET
Basophils Absolute: 0.1 10*3/uL (ref 0.0–0.2)
Basos: 1 %
EOS (ABSOLUTE): 0.1 10*3/uL (ref 0.0–0.4)
Eos: 2 %
Hematocrit: 39.1 % (ref 34.0–46.6)
Hemoglobin: 13.1 g/dL (ref 11.1–15.9)
Immature Grans (Abs): 0 10*3/uL (ref 0.0–0.1)
Immature Granulocytes: 0 %
Lymphocytes Absolute: 2.1 10*3/uL (ref 0.7–3.1)
Lymphs: 29 %
MCH: 26.1 pg — ABNORMAL LOW (ref 26.6–33.0)
MCHC: 33.5 g/dL (ref 31.5–35.7)
MCV: 78 fL — ABNORMAL LOW (ref 79–97)
Monocytes Absolute: 0.6 10*3/uL (ref 0.1–0.9)
Monocytes: 8 %
Neutrophils Absolute: 4.4 10*3/uL (ref 1.4–7.0)
Neutrophils: 60 %
Platelets: 370 10*3/uL (ref 150–450)
RBC: 5.02 x10E6/uL (ref 3.77–5.28)
RDW: 13.3 % (ref 11.7–15.4)
WBC: 7.3 10*3/uL (ref 3.4–10.8)

## 2018-12-11 LAB — LIPID PANEL
Chol/HDL Ratio: 4.1 ratio (ref 0.0–4.4)
Cholesterol, Total: 239 mg/dL — ABNORMAL HIGH (ref 100–199)
HDL: 58 mg/dL (ref >39)
LDL Calculated: 160 mg/dL — ABNORMAL HIGH (ref 0–99)
Triglycerides: 105 mg/dL (ref 0–149)
VLDL Cholesterol Cal: 21 mg/dL (ref 5–40)

## 2018-12-13 ENCOUNTER — Ambulatory Visit: Payer: Self-pay | Admitting: *Deleted

## 2018-12-13 NOTE — Telephone Encounter (Signed)
Contacted by Larita Fife at [facility] to triage pt; pt called with complaints of bilateral arm numbness R>L, and tingling in all fingers of both hands; this has been going on for 3 weeks; the pt also says that her she also says that her ears have been ringing for 2 weeks; she says that the numbness is the worse of her symptoms; she says that the ringing in her ears get louder at night; she complains of bilateral shoulder pain R>L for the past 2 weeks; the pt says that she carpal tunnel before, and alternates on both hands; she rates her shoulder pain at 7 out of 10; the pt says that the pain is worse when she bends her neck; she states that she took 400 mg of advil at 1400 but the pain is back; recommendations made per nurse triage protocol; she verbalized understanding; pt transferred to [facility] for scheduling.   Reason for Disposition . Pain is worsened or caused by bending the neck  Answer Assessment - Initial Assessment Questions 1. ONSET: "When did the pain start?"     2 weeks ago 2. LOCATION: "Where is the pain located?"     Bilateral shoulders R>L 3. PAIN: "How bad is the pain?" (Scale 1-10; or mild, moderate, severe)   - MILD (1-3): doesn't interfere with normal activities   - MODERATE (4-7): interferes with normal activities (e.g., work or school) or awakens from sleep   - SEVERE (8-10): excruciating pain, unable to do any normal activities, unable to move arm at all due to pain    Rated 7 out of 10 4. WORK OR EXERCISE: "Has there been any recent work or exercise that involved this part of the body?"     No 5. CAUSE: "What do you think is causing the shoulder pain?"    Not sure; bilateral arm numbness and tingling fingers on both hands 6. OTHER SYMPTOMS: "Do you have any other symptoms?" (e.g., neck pain, swelling, rash, fever, numbness, weakness)     Back pain, numbness and tingling  7. PREGNANCY: "Is there any chance you are pregnant?" "When was your last menstrual period?"    No LMP  12/01/2018  Protocols used: SHOULDER PAIN-A-AH

## 2018-12-13 NOTE — Telephone Encounter (Signed)
Pt has in office appt on 12/16/18 at 9AM with Dr Selena Batten. FYI to Dr Cody(out of office today) and Dr Ermalene Searing.

## 2018-12-16 ENCOUNTER — Ambulatory Visit: Payer: 59 | Admitting: Family Medicine

## 2018-12-16 ENCOUNTER — Telehealth: Payer: Self-pay

## 2018-12-16 ENCOUNTER — Other Ambulatory Visit: Payer: Self-pay

## 2018-12-16 ENCOUNTER — Encounter: Payer: Self-pay | Admitting: Family Medicine

## 2018-12-16 VITALS — BP 96/72 | HR 78 | Temp 98.7°F | Resp 18 | Ht 61.0 in | Wt 131.0 lb

## 2018-12-16 DIAGNOSIS — H9313 Tinnitus, bilateral: Secondary | ICD-10-CM | POA: Insufficient documentation

## 2018-12-16 DIAGNOSIS — G542 Cervical root disorders, not elsewhere classified: Secondary | ICD-10-CM | POA: Diagnosis not present

## 2018-12-16 DIAGNOSIS — H9311 Tinnitus, right ear: Secondary | ICD-10-CM | POA: Diagnosis not present

## 2018-12-16 DIAGNOSIS — G5600 Carpal tunnel syndrome, unspecified upper limb: Secondary | ICD-10-CM

## 2018-12-16 NOTE — Progress Notes (Signed)
Subjective:     Felicia PilgrimMeriam Holloran is a 43 y.o. female presenting for Numbness (in fingers and arms-bilateral. Its at its worst when she wakes up. Symptoms present x 3 weeks. ) and Shoulder Pain (bilateral. Shoulders blades hurt.)     HPI   #Shoulder pain - b/l shoulder blades - both arms numbness - starting at the shoulder - has been noticing her fingers are tingling - worse at night when she wakes up from sleep - sensation like the body is asleep - some neck pain, but mostly in the shoulder blade area - has not noticed anything that makes the pain worse - improved with massage, icy/hot patches, ibuprofen     #Ringing ear - right side only - noticing x 2 week - denies runny nose, congestion - endorses right sided ear pain as well - no dental pain or sore throat - comes and goes - worse at night - continuous, low pitch sound - worse first thing in the morning - no jaw pain, no loss of hearing - no exposure to loud noises   Review of Systems See HPI  12/13/2018: Phone - B/L arm numbness x 3 weeks. Ears ringing x 2 weeks. Also with shoulder pain. Hx of carpal tunnel syndrome  Social History   Tobacco Use  Smoking Status Never Smoker  Smokeless Tobacco Never Used        Objective:    BP Readings from Last 3 Encounters:  12/16/18 96/72  12/10/18 100/68  06/21/18 (!) 100/54   Wt Readings from Last 3 Encounters:  12/16/18 131 lb (59.4 kg)  12/10/18 128 lb (58.1 kg)  06/21/18 124 lb (56.2 kg)    BP 96/72   Pulse 78   Temp 98.7 F (37.1 C)   Resp 18   Ht 5\' 1"  (1.549 m)   Wt 131 lb (59.4 kg)   LMP 12/01/2018 (Exact Date)   SpO2 99%   BMI 24.75 kg/m    Physical Exam Constitutional:      General: She is not in acute distress.    Appearance: She is well-developed. She is not diaphoretic.  HENT:     Head: Normocephalic and atraumatic.     Right Ear: Tympanic membrane and external ear normal.     Left Ear: Tympanic membrane and external ear normal.      Nose: Nose normal.  Eyes:     Conjunctiva/sclera: Conjunctivae normal.  Neck:     Musculoskeletal: Neck supple.  Cardiovascular:     Rate and Rhythm: Normal rate.  Pulmonary:     Effort: Pulmonary effort is normal.  Musculoskeletal:     Comments: Neck:  Inspection: no deformity Palpation: no ttp ROM: normal Spurlings: positive bilaterally  Shoulder:  Inspection: no deformity Palpation: mildly ttp on muscles medial to the shoulder blade and along the shoulder blade b/l ROM: normal w/o pain Special: some discomfort with rotator cuff testing over the shoulder blade, but no weakness noted  Elbow: normal ROM, positive medial nerve Tinel test b/l  Wrist: notes numbness in 4th/5th digits with phalen and tinel test  Skin:    General: Skin is warm and dry.     Capillary Refill: Capillary refill takes less than 2 seconds.  Neurological:     Mental Status: She is alert. Mental status is at baseline.  Psychiatric:        Mood and Affect: Mood normal.        Behavior: Behavior normal.     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 PT referral      Relevant Orders   Ambulatory referral to Physical Therapy   Cervical nerve root compression - Primary    Given b/l symptoms and numbness starting at the shoulders this makes the most sense with positive spurling. Discussed PT referral and PCP f/u if not improved      Relevant Orders   Ambulatory referral to Physical Therapy     Other   Tinnitus of right ear    Unclear what is causing symptoms. Did say this happened previously and resolved, but flaring up again. Given normal hearing and otherwise normal exam w/o clear etiology will refer to ENT for further consideration.       Relevant Orders   Ambulatory referral to ENT       Return in about 6  weeks (around 01/27/2019), or if symptoms worsen or fail to improve.  Lesleigh Noe, MD

## 2018-12-16 NOTE — Assessment & Plan Note (Signed)
Finger symptoms and nighttime symptoms seem consistent. PT referral

## 2018-12-16 NOTE — Patient Instructions (Addendum)
#  Neck and numbness - go to physical therapy - if not improving over the next 6 weeks return to see Dr. Diona Browner - take Advil 800 mg if needed for pain  #ringing in the ears - referral to the ear specialist

## 2018-12-16 NOTE — Assessment & Plan Note (Signed)
Unclear what is causing symptoms. Did say this happened previously and resolved, but flaring up again. Given normal hearing and otherwise normal exam w/o clear etiology will refer to ENT for further consideration.

## 2018-12-16 NOTE — Assessment & Plan Note (Signed)
Given b/l symptoms and numbness starting at the shoulders this makes the most sense with positive spurling. Discussed PT referral and PCP f/u if not improved

## 2018-12-16 NOTE — Telephone Encounter (Signed)
-----   Message from Salvadore Dom, MD sent at 12/12/2018  5:13 PM EDT ----- Please inform the patient that her total cholesterol and her LDL (bad cholesterol) are elevated. The rest of her blood work is normal. Her cervical cultures are pending.  I would recommend a mediterranean diet and regular exercise.  A mediterranean diet is high in fruits, vegetables, whole grains, fish, chicken, nuts, healthy fats (olive oil or canola oil). Low fat dairy. Limit butter, margarine, red meat and sweets.  She should get her f/u lipids with her primary next year. CC: Dr Diona Browner

## 2018-12-16 NOTE — Telephone Encounter (Signed)
Spoke with patient. Results given. Patient verbalizes understanding. Encounter closed. 

## 2018-12-17 LAB — CHLAMYDIA/GONOCOCCUS/TRICHOMONAS, NAA
Chlamydia by NAA: NEGATIVE
Gonococcus by NAA: NEGATIVE
Trich vag by NAA: NEGATIVE

## 2018-12-23 ENCOUNTER — Encounter: Payer: Self-pay | Admitting: Emergency Medicine

## 2018-12-23 ENCOUNTER — Telehealth: Payer: Self-pay | Admitting: Family Medicine

## 2018-12-23 ENCOUNTER — Telehealth: Payer: Self-pay

## 2018-12-23 ENCOUNTER — Other Ambulatory Visit: Payer: Self-pay

## 2018-12-23 ENCOUNTER — Emergency Department: Payer: 59

## 2018-12-23 ENCOUNTER — Emergency Department
Admission: EM | Admit: 2018-12-23 | Discharge: 2018-12-23 | Disposition: A | Discharge status: Home / Self Care | Payer: 59 | Attending: Emergency Medicine | Admitting: Emergency Medicine

## 2018-12-23 DIAGNOSIS — R531 Weakness: Secondary | ICD-10-CM | POA: Diagnosis not present

## 2018-12-23 DIAGNOSIS — Y999 Unspecified external cause status: Secondary | ICD-10-CM | POA: Insufficient documentation

## 2018-12-23 DIAGNOSIS — R079 Chest pain, unspecified: Secondary | ICD-10-CM | POA: Insufficient documentation

## 2018-12-23 DIAGNOSIS — R0602 Shortness of breath: Secondary | ICD-10-CM

## 2018-12-23 DIAGNOSIS — X500XXA Overexertion from strenuous movement or load, initial encounter: Secondary | ICD-10-CM | POA: Insufficient documentation

## 2018-12-23 DIAGNOSIS — Y93B3 Activity, free weights: Secondary | ICD-10-CM | POA: Diagnosis not present

## 2018-12-23 DIAGNOSIS — S46919A Strain of unspecified muscle, fascia and tendon at shoulder and upper arm level, unspecified arm, initial encounter: Secondary | ICD-10-CM

## 2018-12-23 DIAGNOSIS — Y929 Unspecified place or not applicable: Secondary | ICD-10-CM | POA: Diagnosis not present

## 2018-12-23 DIAGNOSIS — S4991XA Unspecified injury of right shoulder and upper arm, initial encounter: Secondary | ICD-10-CM | POA: Diagnosis present

## 2018-12-23 DIAGNOSIS — S46811A Strain of other muscles, fascia and tendons at shoulder and upper arm level, right arm, initial encounter: Secondary | ICD-10-CM | POA: Insufficient documentation

## 2018-12-23 DIAGNOSIS — Z79899 Other long term (current) drug therapy: Secondary | ICD-10-CM | POA: Insufficient documentation

## 2018-12-23 LAB — CBC WITH DIFFERENTIAL/PLATELET
Abs Immature Granulocytes: 0.02 10*3/uL (ref 0.00–0.07)
Basophils Absolute: 0.1 10*3/uL (ref 0.0–0.1)
Basophils Relative: 1 %
Eosinophils Absolute: 0 10*3/uL (ref 0.0–0.5)
Eosinophils Relative: 0 %
HCT: 38.1 % (ref 36.0–46.0)
Hemoglobin: 12.9 g/dL (ref 12.0–15.0)
Immature Granulocytes: 0 %
Lymphocytes Relative: 21 %
Lymphs Abs: 1.3 10*3/uL (ref 0.7–4.0)
MCH: 25.9 pg — ABNORMAL LOW (ref 26.0–34.0)
MCHC: 33.9 g/dL (ref 30.0–36.0)
MCV: 76.4 fL — ABNORMAL LOW (ref 80.0–100.0)
Monocytes Absolute: 0.3 10*3/uL (ref 0.1–1.0)
Monocytes Relative: 5 %
Neutro Abs: 4.3 10*3/uL (ref 1.7–7.7)
Neutrophils Relative %: 73 %
Platelets: 343 10*3/uL (ref 150–400)
RBC: 4.99 MIL/uL (ref 3.87–5.11)
RDW: 12.5 % (ref 11.5–15.5)
WBC: 5.9 10*3/uL (ref 4.0–10.5)
nRBC: 0 % (ref 0.0–0.2)

## 2018-12-23 LAB — TROPONIN I: Troponin I: <0.03 ng/mL (ref <0.03)

## 2018-12-23 LAB — COMPREHENSIVE METABOLIC PANEL
ALT: 17 U/L (ref 0–44)
AST: 20 U/L (ref 15–41)
Albumin: 4.3 g/dL (ref 3.5–5.0)
Alkaline Phosphatase: 48 U/L (ref 38–126)
Anion gap: 11 (ref 5–15)
BUN: <5 mg/dL — ABNORMAL LOW (ref 6–20)
CO2: 22 mmol/L (ref 22–32)
Calcium: 8.8 mg/dL — ABNORMAL LOW (ref 8.9–10.3)
Chloride: 104 mmol/L (ref 98–111)
Creatinine, Ser: 0.49 mg/dL (ref 0.44–1.00)
GFR calc Af Amer: >60 mL/min (ref >60)
GFR calc non Af Amer: >60 mL/min (ref >60)
Glucose, Bld: 113 mg/dL — ABNORMAL HIGH (ref 70–99)
Potassium: 3.5 mmol/L (ref 3.5–5.1)
Sodium: 137 mmol/L (ref 135–145)
Total Bilirubin: 0.6 mg/dL (ref 0.3–1.2)
Total Protein: 7.4 g/dL (ref 6.5–8.1)

## 2018-12-23 LAB — CK: Total CK: 114 U/L (ref 38–234)

## 2018-12-23 LAB — SEDIMENTATION RATE: Sed Rate: 18 mm/hr (ref 0–20)

## 2018-12-23 NOTE — ED Triage Notes (Signed)
Pt reports pain to right shoulder and numbness. Pt states her MD advised her to come to the ED and have her heart checked.

## 2018-12-23 NOTE — Telephone Encounter (Signed)
[place] Night - Client TELEPHONE ADVICE RECORD [facility] Patient Name: Felicia Mccoy Gender: Female DOB: 02-20-1976 Age: 43 Y 12 M 18 D Return Phone Number: [phone] (Primary), [phone] (Secondary) Address: City/State/Zip: [place] [address] Client [facility] - Client Client Site [place] Physician Eliezer Lofts - MD Contact Type Call Who Is Calling Patient / Member / Family / Caregiver Call Type Triage / Clinical Relationship To Patient Self Return Phone Number [phone] (Secondary) Chief Complaint NUMBNESS/TINGLING- sudden on one side of the body or face Reason for Call Symptomatic / Request for [place] states she was seen, had blood work done, wanting to schedule an appt for today if possible. She is feeling bad, and needing to check on Mercury and kidney tests. Symptoms: Metal taste in mouth, not able to sleep, shoulder blades hurting, urination frequency, drinking alot of water from being thirsty, and arms to fingers tingling and hurting. Translation No Nurse Assessment Nurse: Harlow Mares, RN, Suanne Marker Date/Time (Eastern Time): 12/23/2018 7:41:53 AM Confirm and document reason for call. If symptomatic, describe symptoms. ---Caller states she was seen, had blood work done, wanting to schedule an appt for today if possible. She is feeling bad, and needing to check on Mercury and kidney tests. Symptoms: Metal taste in mouth, not able to sleep, shoulder blades hurting, urination frequency, drinking alot of water from being thirsty, and arms to fingers tingling and hurting. No fever, denies cough or congestion. Reports symptoms began 4 weeks ago, symptoms comes and goes. Has been eating a low carb diet. Denies SOB. Has the patient had close contact with a person known or suspected to have the novel coronavirus illness OR traveled / lives in area  with major community spread (including international travel) in the last 14 days from the onset of symptoms? * If Asymptomatic, screen for exposure and travel within the last 14 days. ---No Does the patient have any new or worsening symptoms? ---Yes Will a triage be completed? ---Yes Related visit to physician within the last 2 weeks? ---Yes Does the PT have any chronic conditions? (i.e. diabetes, asthma, this includes High risk factors for pregnancy, etc.) ---Yes List chronic conditions. ---high chol; PLEASE NOTE: All timestamps contained within this report are represented as [place] Standard Time. CONFIDENTIALTY NOTICE: This fax transmission is intended only for the addressee. It contains information that is legally privileged, confidential or otherwise protected from use or disclosure. If you are not the intended recipient, you are strictly prohibited from reviewing, disclosing, copying using or disseminating any of this information or taking any action in reliance on or regarding this information. If you have received this fax in error, please notify us immediately by telephone so that we can arrange for its return to [place]. Phone: [phone], Toll-Free: [phone], Fax: [phone] Page: 2 of 2 Call Id: [id number] Nurse Assessment Is the patient pregnant or possibly pregnant? (Ask all females between the ages of 12-55) ---No Is this a behavioral health or substance abuse call? ---No Guidelines Guideline Title Affirmed Question Affirmed Notes Nurse Date/Time Eilene Ghazi Time) Chest Pain Pain also in shoulder(s) or arm(s) or jaw Harlow Mares, RN, Suanne Marker 12/23/2018 7:47:17 AM Disp. Time Eilene Ghazi Time) Disposition Final User 12/23/2018 7:39:54 AM Send to Urgent Queue Rosana Fret 12/23/2018 7:51:07 AM Go to ED Now Yes Harlow Mares, RN, Rosalyn Charters Disagree/Comply Comply Caller Understands Yes PreDisposition Did not know what to do Care Advice Given Per Guideline GO TO ED  NOW: * You  need to be seen in the Emergency Department. * Go to the ED at ___________ Hospital. * Leave now. Drive carefully. NOTE TO TRIAGER - DRIVING: * Another adult should drive. * If immediate transportation is not available via car or taxi, then the patient should be instructed to call EMS-[id number]. BRING MEDICINES: * Please bring a list of your current medicines when you go to the Emergency Department (ER). * It is also a good idea to bring the pill bottles too. This will help the doctor to make certain you are taking the right medicines and the right dose. CALL EMS IF: * Severe difficulty breathing occurs * Passes out or becomes too weak to stand * You become worse. CARE ADVICE given per Chest Pain (Adult) guideline. Referrals [facility] - ED

## 2018-12-23 NOTE — Telephone Encounter (Signed)
[facility] Night - Client TELEPHONE ADVICE RECORD [facility] Patient Name: Felicia Mccoy Gender: Female DOB: 11-Jun-1976 Age: 43 Y 110 M 18 D Return Phone Number: [phone] (Primary), [phone] (Secondary) Address: City/State/Zip: [place] [address] Client [facility] - Client Client Site [place] Physician Eliezer Lofts - MD Contact Type Call Who Is Calling Patient / Member / Family / Caregiver Call Type Triage / Clinical Relationship To Patient Self Return Phone Number [phone] (Primary) Chief Complaint Dizziness Reason for Call Symptomatic / Request for Health Information Initial Comment She is having a lot of back and neck pain. She is also having some tingling and a metal taste in her mouth. She is having some dizziness. Translation No Nurse Assessment Nurse: Loletha Grayer, RN, Tammy Date/Time (Eastern Time): 12/23/2018 6:32:13 AM Confirm and document reason for call. If symptomatic, describe symptoms. ---Caller states back in shoulder blades is very painful, dizziness, metal taste in mouth Has the patient had close contact with a person known or suspected to have the novel coronavirus illness OR traveled / lives in area with major community spread (including international travel) in the last 14 days from the onset of symptoms? * If Asymptomatic, screen for exposure and travel within the last 14 days. ---No Does the patient have any new or worsening symptoms? ---Yes Will a triage be completed? ---Yes Related visit to physician within the last 2 weeks? ---Yes Does the PT have any chronic conditions? (i.e. diabetes, asthma, this includes High risk factors for pregnancy, etc.) ---No Is the patient pregnant or possibly pregnant? (Ask all females between the ages of 18-55) ---No Is this a behavioral health or substance abuse call? ---No Guidelines Guideline Title Affirmed Question  Affirmed Notes Nurse Date/Time (Eastern Time) Dizziness - Lightheadedness [1] Weakness (i.e., paralysis, loss of muscle strength) of the face, arm or leg on one side of the body AND [2] sudden Milderd Meager, [place] 12/23/2018 6:36:39 AM PLEASE NOTE: All timestamps contained within this report are represented as [place] Standard Time. CONFIDENTIALTY NOTICE: This fax transmission is intended only for the addressee. It contains information that is legally privileged, confidential or otherwise protected from use or disclosure. If you are not the intended recipient, you are strictly prohibited from reviewing, disclosing, copying using or disseminating any of this information or taking any action in reliance on or regarding this information. If you have received this fax in error, please notify us immediately by telephone so that we can arrange for its return to [place]. Phone: [phone], Toll-Free: [phone], Fax: [phone] Page: 2 of 2 Call Id: [id number] Guidelines Guideline Title Affirmed Question Affirmed Notes Nurse Date/Time Eilene Ghazi Time) onset AND [3] present now Disp. Time Eilene Ghazi Time) Disposition Final User 12/23/2018 6:44:18 AM 911 Outcome Documentation Loletha Grayer, RN, Tammy Reason: Caller refuses to call 911 12/23/2018 6:41:19 AM Call EMS 911 Now Yes Loletha Grayer, RN, Tammy Caller Disagree/Comply Disagree Caller Understands Yes PreDisposition InappropriateToAsk Care Advice Given Per Guideline CALL EMS 911 NOW: * Immediate medical attention is needed. You need to hang up and call 911 (or an ambulance). * Triager Discretion: I'll call you back in a few minutes to be sure you were able to reach them. CARE ADVICE given per Dizziness (Adult) guideline. Referrals GO TO FACILITY REFUSED

## 2018-12-23 NOTE — Telephone Encounter (Signed)
Called pt to schedule her ENT referral.  Pt doesn't want to go anywhere for her tinnitus and wants referral closed.  She has been researching "tinnitus" and saw that it should go away on its own.

## 2018-12-23 NOTE — Telephone Encounter (Signed)
Per chart review tab pt went to [facility] ED this morning. No info in [facility] ED note; I spoke with Angela Nevin at [facility] ED registration pt is waiting at [facility] ED and is in a room now.

## 2018-12-23 NOTE — Telephone Encounter (Signed)
Patient would like to see if we can send referral to a cardiologist . She stated she was in the hospital today and they advised her to call our office for her PCP to send

## 2018-12-23 NOTE — ED Provider Notes (Signed)
[facility] Emergency Department Provider Note   ____________________________________________   First MD Initiated Contact with Patient 12/23/18 [phone]     (approximate)  I have reviewed the triage vital signs and the nursing notes.   HISTORY  Chief Complaint Shoulder Pain and Numbness    HPI Felicia Mccoy is a 43 y.o. female who recently went to see her doctor and was told her cholesterol was 160.  She has begun intensive alteration of her diet and exercising a lot.  She says she runs on a treadmill and then does about 25 minutes of dumbbell work with 3 pound weights.  She reports she is mostly okay during the day but at night when she lays down she gets a pain in the area of her shoulder blades in the back on palpation she is tender in the rhomboids bilaterally.  Patient reports this reproduces her pain.  She also says at night she gets a metallic taste in her mouth and has trouble sleeping past past midnight or so especially if she goes to bed at 11.  She says she is feeling weak and dizzy.  She said she thinks this may be from not getting enough sleep.  He is changed her diet she is eating a lot of vegetables and very little meat.  She had been eating more shellfish shrimp salmon and red snapper.         Past Medical History:  Diagnosis Date   Bell's palsy complicating pregnancy    L facial weakness. 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 Authorizing Provider  Calcium Carbonate-Vit D-Min (CALCIUM 1200 PO) Take by mouth.    [provider]  doxycycline (VIBRAMYCIN) 100 MG capsule Take 1 capsule (100 mg total) by mouth 2 (two) times daily. Take BID for 10 days.  Take with food as can cause GI distress. 12/10/18   Romualdo BolkJertson, Jill Evelyn, MD  vitamin C (ASCORBIC ACID) 500 MG tablet Take 500 mg by mouth daily.    [provider]    Allergies Nubain [nalbuphine hcl] and Stadol [butorphanol tartrate]  No family history on file.  Social History Social History   Tobacco Use   Smoking status: Never Smoker   Smokeless tobacco: Never Used  Substance Use Topics   Alcohol use: No   Drug use: No    Review of Systems  Constitutional: No fever/chills Eyes: No visual changes. ENT: No sore throat. Cardiovascular: Denies chest pain. Respiratory: Denies shortness of breath. Gastrointestinal: No abdominal pain.  No nausea, no vomiting.  No diarrhea.  No constipation. Genitourinary: Negative for dysuria. Musculoskeletal: Negative for back pain. Skin: Negative for rash. Neurological: Negative for headaches, focal weakness  ____________________________________________   PHYSICAL EXAM:  VITAL SIGNS: ED Triage  Vitals  Enc Vitals Group     BP 12/23/18 0848 122/80     Pulse Rate 12/23/18 0848 (!) 113     Resp --      Temp 12/23/18 0848 98.1 F (36.7 C)     Temp Source 12/23/18 0848 Oral     SpO2 12/23/18 0848 100 %     Weight 12/23/18 0838 125 lb (56.7 kg)     Height 12/23/18 0838 5\' 1"  (1.549 m)     Head Circumference --      Peak Flow --      Pain Score 12/23/18 0837 8     Pain Loc --      Pain Edu? --      Excl. in [place]? --     Constitutional: Alert and oriented. Well appearing and in no acute distress. Eyes: Conjunctivae are normal. Head:  Atraumatic. Nose: No congestion/rhinnorhea. Mouth/Throat: Mucous membranes are moist.  Oropharynx non-erythematous. Neck: No stridor.  Cardiovascular: Normal rate, regular rhythm. Grossly normal heart sounds.  Good peripheral circulation. Respiratory: Normal respiratory effort.  No retractions. Lungs CTAB. Gastrointestinal: Soft and nontender. No distention. No abdominal bruits. No CVA tenderness. Musculoskeletal: No lower extremity tenderness nor edema.  Palpation of the area between the spinal cord and the scapula reproduces the patient's pain.  There is no pain on palpation elsewhere. Neurologic:  Normal speech and language. No gross focal neurologic deficits are appreciated. No gait instability. Skin:  Skin is warm, dry and intact. No rash noted.   ____________________________________________   LABS (all labs ordered are listed, but only abnormal results are displayed)  Labs Reviewed  COMPREHENSIVE METABOLIC PANEL - Abnormal; Notable for the following components:      Result Value   Glucose, Bld 113 (*)    BUN <5 (*)    Calcium 8.8 (*)    All other components within normal limits  CBC WITH DIFFERENTIAL/PLATELET - Abnormal; Notable for the following components:   MCV 76.4 (*)    MCH 25.9 (*)    All other components within normal limits  TROPONIN I  SEDIMENTATION RATE  CK   ____________________________________________  EKG  EKG read and interpreted by me shows sinus tachycardia rate of 109 normal axis there is some slight ST segment depression in several leads ____________________________________________  RADIOLOGY  ED MD interpretation:   Official radiology report(s): Dg Chest 2 View  Result Date: 12/23/2018 CLINICAL DATA:  Chest and back discomfort EXAM: CHEST - 2 VIEW COMPARISON:  02/27/2018 FINDINGS: The heart size and mediastinal contours are within normal limits. Both lungs are clear. The visualized skeletal structures are unremarkable. IMPRESSION: No acute  abnormality of the lungs. Electronically Signed   By: Eddie Candle M.D.   On: 12/23/2018 09:32    ____________________________________________   PROCEDURES  Procedure(s) performed (including Critical Care):  Procedures   ____________________________________________   INITIAL IMPRESSION / ASSESSMENT AND PLAN / ED COURSE  Felicia Mccoy was evaluated in Emergency Department on 12/23/2018 for the symptoms described in the history of present illness. She was evaluated in the context of the global COVID-19 pandemic, which necessitated consideration that the patient might be at risk for infection with the SARS-CoV-2 virus that causes COVID-19. Institutional protocols and algorithms that pertain to the evaluation of patients at risk for COVID-19 are in a state of rapid change based on information released by regulatory bodies including the CDC and federal and state organizations. These policies and algorithms were followed during the patient's care in the ED.  Patient's lab work as I understand that she recently went from doing not very much exercise to doing 25 minutes of dumbbell work a day in addition to treadmill work as well.  Is I think most likely that she is just having some muscle strain that gets worse at night when she is not being distracted from it by her everyday activities.  I will have her try some Motrin for that have her return if she is any worse follow-up with her doctor.             ____________________________________________   FINAL CLINICAL IMPRESSION(S) / ED DIAGNOSES  Final diagnoses:  Strain of shoulder, unspecified laterality, initial encounter     ED Discharge Orders    None       Note:  This document was prepared using Dragon voice recognition software and may include unintentional dictation errors.    Arnaldo NatalMalinda, Natanel Snavely F, MD 12/23/18 [phone]

## 2018-12-23 NOTE — Discharge Instructions (Signed)
I think the pain you are having may be from your rapid increase in exercise.  What I want you to do is to stop exercising for about 5 days.  Then resume your exercising but start slowly and increase the amount of exercising you do.  For example use the 3 pound weights but only for 5 minutes for 2 days and then 10 minutes for 2 more days and then 15 minutes for 2 more days and increase gradually like that.  Please do the same with the treadmill.  Use Motrin for the aches and pains.  You can take up to 4 of the over-the-counter pills 3 times a day with food if you need it.  Also I do not think you have to avoid red meat entirely.  You can have it once or twice a week without causing a great deal of difficulty.  Do not forget poultry is also good for you.

## 2018-12-24 NOTE — Telephone Encounter (Signed)
I will go ahead and make cardiology referral.

## 2018-12-24 NOTE — Addendum Note (Signed)
Addended by: Eliezer Lofts E on: 12/24/2018 03:23 PM   Modules accepted: Orders

## 2018-12-24 NOTE — Telephone Encounter (Signed)
Call pt... ER visit looked more like shoulder pain... what is the issue she needs to be evaluated for? I need an appropriate diagnosis.  If she is unsure..,. make virtual  ER follow up with me to assess next best step.

## 2018-12-24 NOTE — Telephone Encounter (Signed)
Spoke with Felicia Mccoy.  She states she need cardiology visit because she is still having SOB.  So I offered a virtual appointment with Dr. Diona Browner to follow up on ER visit so Dr. Diona Browner could assess for next best step.  Patient states she is currently at work and can't schedule visit at this time.  She states she will call back to schedule at a better time.

## 2018-12-26 ENCOUNTER — Ambulatory Visit (INDEPENDENT_AMBULATORY_CARE_PROVIDER_SITE_OTHER): Payer: 59 | Admitting: Family Medicine

## 2018-12-26 ENCOUNTER — Encounter: Payer: Self-pay | Admitting: Family Medicine

## 2018-12-26 DIAGNOSIS — E7800 Pure hypercholesterolemia, unspecified: Secondary | ICD-10-CM | POA: Diagnosis not present

## 2018-12-26 DIAGNOSIS — R0602 Shortness of breath: Secondary | ICD-10-CM | POA: Diagnosis not present

## 2018-12-26 DIAGNOSIS — R5383 Other fatigue: Secondary | ICD-10-CM

## 2018-12-26 DIAGNOSIS — H9311 Tinnitus, right ear: Secondary | ICD-10-CM | POA: Diagnosis not present

## 2018-12-26 MED ORDER — ATORVASTATIN CALCIUM 10 MG PO TABS: 10.0000 mg | ORAL_TABLET | Freq: Every day | ORAL | 3 refills | Status: DC

## 2018-12-26 NOTE — Assessment & Plan Note (Signed)
No clear hearing loss.. neg exam at last appt with Dr. Einar Pheasant.

## 2018-12-26 NOTE — Assessment & Plan Note (Signed)
Consider thyroid eval if not improving.

## 2018-12-26 NOTE — Assessment & Plan Note (Signed)
Pt has referral to cardiology for further eval.   neg cbc.

## 2018-12-26 NOTE — Progress Notes (Signed)
VIRTUAL VISIT Due to national recommendations of social distancing due to [place] 19, a virtual visit is felt to be most appropriate for this patient at this time.   I connected with the patient on 12/26/18 at  4:00 PM EDT by virtual telehealth platform and verified that I am speaking with the correct person using two identifiers.   I discussed the limitations, risks, security and privacy concerns of performing an evaluation and management service by  virtual telehealth platform and the availability of in person appointments. I also discussed with the patient that there may be a patient responsible charge related to this service. The patient expressed understanding and agreed to proceed.  Patient location: Home Provider Location: [facility] Participants: Eliezer Lofts and Nelia Shi   Chief Complaint  Patient presents with  . Shortness of Breath    Seen in ED on 12/23/2018    History of Present Illness:  42 year old female presents for follow up ER visit for  shoulder strain On 12/23/2018.   Dx with pulled muscle in shoulder with exercise. She does not agree with this diagnosis.   She is still concerned given sudden onset of " crawling feel on upper back/shoulder blade" when lying down. Not really pain.  She has had some chest tightness at rest, no change with exertion. She is more tired with exertion and SOB with exertion in last few weeks.   Some tinnitus at night.  In ER, no EKG done.  tropinin I nml  cbc nml  sed rate, ck nml   [place] symptoms few years ago..saw cardiology, had ECHO ( heart squeezing well) and lipids were high. Told she had a lot of fat in her heart. Started on lipitor, low chol diet.. helped her feel much better.  Has appt to see cardiology 01/08/2019  recent chol high at GYN. Lab Results  Component Value Date   CHOL 239 (H) 12/10/2018   HDL 58 12/10/2018   LDLCALC 160 (H) 12/10/2018   LDLDIRECT 153.3 12/03/2012   TRIG 105 12/10/2018   CHOLHDL 4.1 12/10/2018     She had COVID 19 screen No recent travel or known exposure to [place] The patient denies respiratory symptoms of COVID 19 at this time.  The importance of social distancing was discussed today.   Review of Systems  Constitutional: Negative for chills and fever.  HENT: Negative for congestion and ear pain.   Eyes: Negative for pain and redness.  Respiratory: Negative for cough and shortness of breath.   Cardiovascular: Negative for chest pain, palpitations and leg swelling.  Gastrointestinal: Negative for abdominal pain, blood in stool, constipation, diarrhea, nausea and vomiting.  Genitourinary: Negative for dysuria.  Musculoskeletal: Negative for falls and myalgias.  Skin: Negative for rash.  Neurological: Negative for dizziness.  Psychiatric/Behavioral: Negative for depression. The patient is not nervous/anxious.       Past Medical History:  Diagnosis Date  . Bell's palsy complicating pregnancy    L facial weakness. Present for a week  . GERD (gastroesophageal reflux disease)   . Mental disorder   . No pertinent past medical history   . Panic attacks   . Ulcer     reports that she has never smoked. She has never used smokeless tobacco. She reports that she does not drink alcohol or use drugs.   Current Outpatient Medications:  .  Calcium Carbonate-Vit D-Min (CALCIUM 1200 PO), Take by mouth., Disp: , Rfl:  .  vitamin C (  ASCORBIC ACID) 500 MG tablet, Take 500 mg by mouth daily., Disp: , Rfl:    Observations/Objective: Last menstrual period 12/01/2018.  Physical Exam  Physical Exam Constitutional:      General: The patient is not in acute distress. Pulmonary:     Effort: Pulmonary effort is normal. No respiratory distress.  Neurological:     Mental Status: The patient is alert and oriented to person, place, and time.  Psychiatric:        Mood and Affect: Mood normal.        Behavior: Behavior normal.   Assessment  and Plan High cholesterol Start low dose statin to decreased CVD risk. Encouraged exercise, weight loss, healthy eating habits.   SOB (shortness of breath) on exertion Pt has referral to cardiology for further eval.   neg cbc.  Fatigue Consider thyroid eval if not improving.     I discussed the assessment and treatment plan with the patient. The patient was provided an opportunity to ask questions and all were answered. The patient agreed with the plan and demonstrated an understanding of the instructions.   The patient was advised to call back or seek an in-person evaluation if the symptoms worsen or if the condition fails to improve as anticipated.     Kerby NoraAmy Bedsole, MD

## 2018-12-26 NOTE — Patient Instructions (Signed)
Start lipitor for cholesterol.  keep appt with cardiology.  Work on low cholesterol low carb diet.  Will re- eval chol at next appt in 3 months. Will check thyroid then if you are still feeling abd.

## 2018-12-26 NOTE — Assessment & Plan Note (Signed)
Start low dose statin to decreased CVD risk. Encouraged exercise, weight loss, healthy eating habits.

## 2019-01-07 ENCOUNTER — Telehealth: Payer: Self-pay | Admitting: Cardiology

## 2019-01-07 NOTE — Telephone Encounter (Signed)
LVM, reminding pt of her appt with Dr Harrell Gave on 01-08-19.

## 2019-01-08 ENCOUNTER — Encounter: Payer: Self-pay | Admitting: Cardiology

## 2019-01-08 ENCOUNTER — Other Ambulatory Visit: Payer: Self-pay

## 2019-01-08 ENCOUNTER — Ambulatory Visit (INDEPENDENT_AMBULATORY_CARE_PROVIDER_SITE_OTHER): Payer: 59 | Admitting: Cardiology

## 2019-01-08 VITALS — BP 104/72 | HR 78 | Temp 97.5°F | Ht 61.5 in | Wt 120.3 lb

## 2019-01-08 DIAGNOSIS — Z7189 Other specified counseling: Secondary | ICD-10-CM

## 2019-01-08 DIAGNOSIS — R079 Chest pain, unspecified: Secondary | ICD-10-CM

## 2019-01-08 DIAGNOSIS — R0602 Shortness of breath: Secondary | ICD-10-CM

## 2019-01-08 DIAGNOSIS — E7800 Pure hypercholesterolemia, unspecified: Secondary | ICD-10-CM

## 2019-01-08 DIAGNOSIS — R0789 Other chest pain: Secondary | ICD-10-CM

## 2019-01-08 MED ORDER — PRAVASTATIN SODIUM 20 MG PO TABS: 20.0000 mg | ORAL_TABLET | Freq: Every evening | ORAL | 11 refills | Status: DC

## 2019-01-08 NOTE — Progress Notes (Signed)
Cardiology Office Note:    Date:  01/08/2019   ID:  Felicia Mccoy, DOB [birth year], MRN [id number]  PCP:  Excell SeltzerBedsole, Amy E, MD  Cardiologist:  Jodelle RedBridgette Laurina Fischl, MD PhD  Referring MD: Excell SeltzerBedsole, Amy E, MD   CC: new consult for dyspnea and chest tightness at night  History of Present Illness:    Felicia Mccoy is a 43 y.o. female with a hx of hypercholesterolemia who is seen as a new consult at the request of Bedsole, Amy E, MD for the evaluation and management of dyspnea and chest tightness at night.  HPI: -Initial onset: 3 weeks ago, woke her up at night between midnight and 3 AM. Feels short of breath and like something was crawling over her back. Felt like her head was squeezing. Pain is worst in the back between shoulder blades and moves forward to the front of her chest. Hard to describe, feels electric/crawling. Has had this in the past, told it was [place]  -Quality: electric/crawling in the back, burning/acid in the chest. Then feels like her heart is racing with this.  -Frequency:every night -Duration: takes about 2 hours to get better in the morning. Better after drinking water and sitting up. -Associated symptoms: feels like she can't swallow. Has felt burning in her throat with an acid taste as well, had these symptoms when she went to ER. -Aggravating/alleviating factors: No issues if she only eats fruits or vegetables, better if she is active. If she is sedentary or eats fatty food/bread then the pain comes back. Better after drinking water, sitting up. Has to sleep on several pillows to help the pain. -Prior cardiac history: high cholesterol. Seen in the ER 12/23/18 for similar symptoms, negative troponins, normal CXR -Prior ECG: normal -Prior workup: [place], reports prior echo, was told she had too much cholesterol -Prior treatment: lipitor made her constipated. Has tried no GI meds/antacids/PPI -Alcohol: none -Tobacco: never  -Comorbidities: hypercholesterolemia. -Exercise level: does ADLs, walks outside, stays active without issue. No intense exercise. Actively working to lose Liberty Globalweight/change diet since high cholesterol test -Cardiac ROS: no PND, no orthopnea, no LE edema, no syncope -Family history: mother, sister, grandmother all have high cholesterol. No known heart attack or stroke, no known heart disease of any type.  Past Medical History:  Diagnosis Date  . Bell's palsy complicating pregnancy    L facial weakness. Present for a week  . GERD (gastroesophageal reflux disease)   . Mental disorder   . No pertinent past medical history   . Panic attacks   . Ulcer     Past Surgical History:  Procedure Laterality Date  . CESAREAN SECTION  05/13/2011   Procedure: CESAREAN SECTION;  Surgeon: Robley FriesVaishali R Mody;  Location: [facility];  Service: Gynecology;  Laterality: N/A;  . NO PAST SURGERIES      Current Medications: Current Outpatient Medications on File Prior to Visit  Medication Sig  . atorvastatin (LIPITOR) 10 MG tablet Take 1 tablet (10 mg total) by mouth daily.  . Calcium Carbonate-Vit D-Min (CALCIUM 1200 PO) Take by mouth.  . vitamin C (ASCORBIC ACID) 500 MG tablet Take 500 mg by mouth daily.   No current facility-administered medications on file prior to visit.      Allergies:   Nubain [nalbuphine hcl] and Stadol [butorphanol tartrate]   Social History   Socioeconomic History  . Marital status: Married    Spouse name: Not on file  . Number of children: Not on file  .  Years of education: Not on file  . Highest education level: Not on file  Occupational History  . Not on file  Social Needs  . Financial resource strain: Not on file  . Food insecurity    Worry: Not on file    Inability: Not on file  . Transportation needs    Medical: Not on file    Non-medical: Not on file  Tobacco Use  . Smoking status: Never Smoker  . Smokeless tobacco: Never Used  Substance and Sexual Activity  .  Alcohol use: No  . Drug use: No  . Sexual activity: Yes    Partners: Male    Birth control/protection: Condom  Lifestyle  . Physical activity    Days per week: Not on file    Minutes per session: Not on file  . Stress: Not on file  Relationships  . Social Musicianconnections    Talks on phone: Not on file    Gets together: Not on file    Attends religious service: Not on file    Active member of club or organization: Not on file    Attends meetings of clubs or organizations: Not on file    Relationship status: Not on file  Other Topics Concern  . Not on file  Social History Narrative   Regular exercise--yes, 2 times a week running, lifts weight   Diet: Fruit and veggies,water     Family History: High cholesterol in mother, sister, grandmother. No history of heart disease.   ROS:   Please see the history of present illness.  Additional pertinent ROS:  Constitutional: Negative for chills, fever, night sweats, unintentional weight loss  HENT: Negative for ear pain and hearing loss.   Eyes: Negative for loss of vision and eye pain.  Respiratory: Negative for cough, sputum, shortness of breath, wheezing.   Cardiovascular: See HPI. Gastrointestinal: Negative for abdominal pain, melena, and hematochezia.  Genitourinary: Negative for dysuria and hematuria.  Musculoskeletal: Negative for falls and myalgias.  Skin: Negative for itching and rash.  Neurological: Negative for focal weakness, focal sensory changes and loss of consciousness.  Endo/Heme/Allergies: Does not bruise/bleed easily.    EKGs/Labs/Other Studies Reviewed:    The following studies were reviewed today: No available cardiac studies  EKG:  EKG is personally reviewed.  The ekg ordered today demonstrates NSR  Recent Labs: 12/23/2018: ALT 17; BUN <5; Creatinine, Ser 0.49; Hemoglobin 12.9; Platelets 343; Potassium 3.5; Sodium 137  Recent Lipid Panel    Component Value Date/Time   CHOL 239 (H) 12/10/2018 1423   TRIG  105 12/10/2018 1423   HDL 58 12/10/2018 1423   CHOLHDL 4.1 12/10/2018 1423   CHOLHDL 3.8 04/13/2017 1626   VLDL 15.4 12/03/2012 1104   LDLCALC 160 (H) 12/10/2018 1423   LDLCALC 144 (H) 04/13/2017 1626   LDLDIRECT 153.3 12/03/2012 1104    Physical Exam:    VS:  BP 104/72   Pulse 78   Temp (!) 97.5 F (36.4 C)   Ht 5' 1.5" (1.562 m)   Wt 120 lb 4.8 oz (54.6 kg)   SpO2 99%   BMI 22.36 kg/m     Wt Readings from Last 3 Encounters:  01/08/19 120 lb 4.8 oz (54.6 kg)  12/23/18 125 lb (56.7 kg)  12/16/18 131 lb (59.4 kg)     GEN: Well nourished, well developed in no acute distress HEENT: Normal NECK: No JVD; No carotid bruits LYMPHATICS: No lymphadenopathy CARDIAC: regular rhythm, normal S1 and S2, no murmurs, rubs,  gallops. Radial and DP pulses 2+ bilaterally. RESPIRATORY:  Clear to auscultation without rales, wheezing or rhonchi  ABDOMEN: Soft, non-tender, non-distended MUSCULOSKELETAL:  No edema; No deformity  SKIN: Warm and dry NEUROLOGIC:  Alert and oriented x 3 PSYCHIATRIC:  Normal affect   ASSESSMENT:    1. Chest pain, unspecified type   2. Cardiac risk counseling   3. Counseling on health promotion and disease prevention   4. Shortness of breath   5. Pure hypercholesterolemia    PLAN:    Shoulder/chest pain and shortness of breath: Discussed extensively today. Atypical for cardiac source as it is non exertional (and actually improves with exertion), is related to position and food. Starts in the back/between shoulders blades. Has been associated with dysphagia, acid taste, and burning in esophagus (though not every time).  -Differential dx includes GERD (highest suspicion given associated factors) vs. MSK pain (has recently worked to increase activity level). -we discussed options to make sure this is not cardiac in nature. She is very concerned as she was [place] that this sensation was related to her cholesterol. We discussed that an echocardiogram  assesses function and structure but is not designed to investigate plaque in blood vessels -spent extensive time today reviewing cholesterol, plaque, pathology of CAD. Discussed risk factors at length. Discussed testing that we do to exclude this. -she would be a candidate for a treadmill stress test, but as her risk is low to start with, the predictive value of this test is low -we discussed at length cardiac CT to definitively evaluate coronary anatomy. We discussed that this can show blockages as well as early disease. Reviewed the procedure, risks/benefits. She will consider and let us know -discussed trial of antacid/PPI to see if it helps her symptoms. She does not think it is GI related, declines this for now.  Hypercholesterolemia: elevated LDL of 160 mg/dL. Not tolerating atorvastatin due to constipation. Showed graph of statin comparisons. She is willing to try pravastatin -stop atorvastatin, washout 1 week, then start pravastatin -recheck lipids at PCP follow up in September -goal for prevention <130  Cardiac risk counseling and prevention recommendations: -recommend heart healthy/Mediterranean diet, with whole grains, fruits, vegetable, fish, lean meats, nuts, and olive oil. Limit salt. She is working very hard on her diet. Did discuss vegan diet as she was asking about the most effective diet changes to lower LDL.  -recommend moderate walking, 3-5 times/week for 30-50 minutes each session. Aim for at least 150 minutes.week. Goal should be pace of 3 miles/hours, or walking 1.5 miles in 30 minutes -recommend avoidance of tobacco products. Avoid excess alcohol. -Additional risk factor control:  -Diabetes: A1c is not available, denies history  -Lipids: as above  -Blood pressure control: at goal, no history of HTN  -Weight: BMI 22 -ASCVD risk score: The 10-year ASCVD risk score Denman George(Goff DC Montez HagemanJr., et al., 2013) is: 0.5%   Values used to calculate the score:     Age: [age] years     Sex: Female      Is Non-Hispanic African American: No     Diabetic: No     Tobacco smoker: No     Systolic Blood Pressure: 104 mmHg     Is BP treated: No     HDL Cholesterol: 58 mg/dL     Total Cholesterol: 239 mg/dL    Plan for follow up: PRN. She will call if she decides to pursue cardiac CT for further evaluation/risk stratification  Medication Adjustments/Labs and Tests  Ordered: Current medicines are reviewed at length with the patient today.  Concerns regarding medicines are outlined above.  Orders Placed This Encounter  Procedures  . EKG 12-Lead   Meds ordered this encounter  Medications  . pravastatin (PRAVACHOL) 20 MG tablet    Sig: Take 1 tablet (20 mg total) by mouth every evening.    Dispense:  30 tablet    Refill:  11    Patient Instructions  Medication Instructions:  -Recommend trialing either an antacid like Tums/Rolaids or an acid reducer (like omeprazole) to see if it helps your symptoms -stop atorvastatin, wait one week, then start pravastatin 20 mg daily   If you need a refill on your cardiac medications before your next appointment, please call your pharmacy.   Lab work: None  Testing/Procedures: None   Follow-Up: Your physician recommends that you schedule a follow-up appointment as needed with Dr. Harrell Gave  -Please call us if you want to proceed with the cardiac CT scan -follow up with your PCP in September as schedule to recheck cholesterol -Call if anything changes.      Signed, Buford Dresser, MD PhD 01/08/2019 9:56 AM    [place]

## 2019-01-08 NOTE — Patient Instructions (Addendum)
Medication Instructions:  -Recommend trialing either an antacid like Tums/Rolaids or an acid reducer (like omeprazole) to see if it helps your symptoms -stop atorvastatin, wait one week, then start pravastatin 20 mg daily   If you need a refill on your cardiac medications before your next appointment, please call your pharmacy.   Lab work: None  Testing/Procedures: None   Follow-Up: Your physician recommends that you schedule a follow-up appointment as needed with Dr. Harrell Gave  -Please call us if you want to proceed with the cardiac CT scan -follow up with your PCP in September as schedule to recheck cholesterol -Call if anything changes.

## 2019-01-16 ENCOUNTER — Other Ambulatory Visit (INDEPENDENT_AMBULATORY_CARE_PROVIDER_SITE_OTHER): Payer: 59

## 2019-01-16 DIAGNOSIS — E7800 Pure hypercholesterolemia, unspecified: Secondary | ICD-10-CM

## 2019-01-16 DIAGNOSIS — R5383 Other fatigue: Secondary | ICD-10-CM

## 2019-01-17 LAB — COMPREHENSIVE METABOLIC PANEL
ALT: 13 U/L (ref 0–35)
AST: 16 U/L (ref 0–37)
Albumin: 4.7 g/dL (ref 3.5–5.2)
Alkaline Phosphatase: 40 U/L (ref 39–117)
BUN: 4 mg/dL — ABNORMAL LOW (ref 6–23)
CO2: 24 mEq/L (ref 19–32)
Calcium: 9.4 mg/dL (ref 8.4–10.5)
Chloride: 104 mEq/L (ref 96–112)
Creatinine, Ser: 0.54 mg/dL (ref 0.40–1.20)
GFR: 124.14 mL/min (ref >60.00)
Glucose, Bld: 88 mg/dL (ref 70–99)
Potassium: 3.8 mEq/L (ref 3.5–5.1)
Sodium: 138 mEq/L (ref 135–145)
Total Bilirubin: 0.7 mg/dL (ref 0.2–1.2)
Total Protein: 7.1 g/dL (ref 6.0–8.3)

## 2019-01-17 LAB — LIPID PANEL
Cholesterol: 112 mg/dL (ref 0–200)
HDL: 43.4 mg/dL (ref >39.00)
LDL Cholesterol: 57 mg/dL (ref 0–99)
NonHDL: 68.4
Total CHOL/HDL Ratio: 3
Triglycerides: 56 mg/dL (ref 0.0–149.0)
VLDL: 11.2 mg/dL (ref 0.0–40.0)

## 2019-01-17 LAB — TSH: TSH: 1.13 u[IU]/mL (ref 0.35–4.50)

## 2019-03-13 ENCOUNTER — Telehealth: Payer: Self-pay

## 2019-03-13 NOTE — Telephone Encounter (Signed)
Left detailed VM w COVID screen and back door lab info and front door info   

## 2019-03-15 ENCOUNTER — Telehealth: Payer: Self-pay | Admitting: Family Medicine

## 2019-03-15 DIAGNOSIS — E7800 Pure hypercholesterolemia, unspecified: Secondary | ICD-10-CM

## 2019-03-15 NOTE — Telephone Encounter (Signed)
Just had labs in [place]... no need for further.

## 2019-03-15 NOTE — Telephone Encounter (Signed)
-----   Message from Terri J Walsh sent at 03/11/2019 10:37 AM EDT ----- Regarding: Lab orders for Tuesday, 9.8.20 Patient is scheduled for CPX labs, please order future labs, Thanks , Terri  

## 2019-03-18 ENCOUNTER — Other Ambulatory Visit: Payer: 59

## 2019-03-18 ENCOUNTER — Other Ambulatory Visit: Payer: Self-pay

## 2019-03-21 ENCOUNTER — Ambulatory Visit: Payer: 59 | Admitting: Family Medicine

## 2019-07-17 ENCOUNTER — Other Ambulatory Visit: Payer: Self-pay

## 2019-07-17 ENCOUNTER — Ambulatory Visit: Payer: 59 | Admitting: Family Medicine

## 2019-07-17 ENCOUNTER — Encounter: Payer: Self-pay | Admitting: Family Medicine

## 2019-07-17 ENCOUNTER — Ambulatory Visit (INDEPENDENT_AMBULATORY_CARE_PROVIDER_SITE_OTHER): Payer: 59 | Admitting: Family Medicine

## 2019-07-17 VITALS — BP 80/60 | HR 73 | Temp 98.0°F | Ht 61.0 in | Wt 109.2 lb

## 2019-07-17 DIAGNOSIS — E7800 Pure hypercholesterolemia, unspecified: Secondary | ICD-10-CM

## 2019-07-17 DIAGNOSIS — R1013 Epigastric pain: Secondary | ICD-10-CM

## 2019-07-17 DIAGNOSIS — K219 Gastro-esophageal reflux disease without esophagitis: Secondary | ICD-10-CM | POA: Diagnosis not present

## 2019-07-17 LAB — COMPREHENSIVE METABOLIC PANEL
ALT: 16 U/L (ref 0–35)
AST: 19 U/L (ref 0–37)
Albumin: 4.7 g/dL (ref 3.5–5.2)
Alkaline Phosphatase: 61 U/L (ref 39–117)
BUN: 9 mg/dL (ref 6–23)
CO2: 28 mEq/L (ref 19–32)
Calcium: 9.7 mg/dL (ref 8.4–10.5)
Chloride: 101 mEq/L (ref 96–112)
Creatinine, Ser: 0.57 mg/dL (ref 0.40–1.20)
GFR: 116.35 mL/min (ref >60.00)
Glucose, Bld: 92 mg/dL (ref 70–99)
Potassium: 3.8 mEq/L (ref 3.5–5.1)
Sodium: 136 mEq/L (ref 135–145)
Total Bilirubin: 0.6 mg/dL (ref 0.2–1.2)
Total Protein: 8 g/dL (ref 6.0–8.3)

## 2019-07-17 LAB — CBC WITH DIFFERENTIAL/PLATELET
Basophils Absolute: 0 10*3/uL (ref 0.0–0.1)
Basophils Relative: 0.5 % (ref 0.0–3.0)
Eosinophils Absolute: 0.1 10*3/uL (ref 0.0–0.7)
Eosinophils Relative: 0.7 % (ref 0.0–5.0)
HCT: 42.9 % (ref 36.0–46.0)
Hemoglobin: 13.9 g/dL (ref 12.0–15.0)
Lymphocytes Relative: 27.7 % (ref 12.0–46.0)
Lymphs Abs: 2.2 10*3/uL (ref 0.7–4.0)
MCHC: 32.4 g/dL (ref 30.0–36.0)
MCV: 80.9 fl (ref 78.0–100.0)
Monocytes Absolute: 0.5 10*3/uL (ref 0.1–1.0)
Monocytes Relative: 6.8 % (ref 3.0–12.0)
Neutro Abs: 5.1 10*3/uL (ref 1.4–7.7)
Neutrophils Relative %: 64.3 % (ref 43.0–77.0)
Platelets: 353 10*3/uL (ref 150.0–400.0)
RBC: 5.3 Mil/uL — ABNORMAL HIGH (ref 3.87–5.11)
RDW: 13.6 % (ref 11.5–15.5)
WBC: 8 10*3/uL (ref 4.0–10.5)

## 2019-07-17 LAB — LIPID PANEL
Cholesterol: 216 mg/dL — ABNORMAL HIGH (ref 0–200)
HDL: 68.9 mg/dL (ref >39.00)
LDL Cholesterol: 133 mg/dL — ABNORMAL HIGH (ref 0–99)
NonHDL: 147.44
Total CHOL/HDL Ratio: 3
Triglycerides: 72 mg/dL (ref 0.0–149.0)
VLDL: 14.4 mg/dL (ref 0.0–40.0)

## 2019-07-17 LAB — LIPASE: Lipase: 11 U/L (ref 11.0–59.0)

## 2019-07-17 NOTE — Assessment & Plan Note (Signed)
Stopped medication... working almost too aggressively on diet.. reviewed healthy high protein low chol diet. Re-eval today.

## 2019-07-17 NOTE — Patient Instructions (Addendum)
Start OTC prilosec 20 mg daily x 4-6 weeks.  Please stop at the lab to have labs drawn. Call if no improvement in 2 weeks, sooner if worsening.

## 2019-07-17 NOTE — Assessment & Plan Note (Signed)
Start PPI,   Trigger avoidance.   Given some epigastric pain on exam.. eval with cbc and CMET.

## 2019-07-17 NOTE — Progress Notes (Signed)
Chief Complaint  Patient presents with  . Gastroesophageal Reflux    History of Present Illness: HPI  44 year old female  With history of PUD presents with new onset GERD.  She has been eating more veggies and fruits.. she has lost a lot of weight quickly... she was trying to lower cholesterol.  She as noted  Since 02/2019 she has had heartburn,  Burning chest pain, pain through to back to shoulder blades. No epigastric pain.  Worse when trying to sleep at night.   She has noted her hair falling out with the weight loss as well.    She is not taking citris, tomatos foods. She is no longer taking vit C daily.  no ASA, ibuprofen. Increased stress.  Wt Readings from Last 3 Encounters:  07/17/19 109 lb 4 oz (49.6 kg)  01/08/19 120 lb 4.8 oz (54.6 kg)  12/23/18 125 lb (56.7 kg)    She has stopped the cholesterol medication.  This visit occurred during the SARS-CoV-2 public health emergency.  Safety protocols were in place, including screening questions prior to the visit, additional usage of staff PPE, and extensive cleaning of exam room while observing appropriate contact time as indicated for disinfecting solutions.   COVID 19 screen:  No recent travel or known exposure to COVID19 The patient denies respiratory symptoms of COVID 19 at this time. The importance of social distancing was discussed today.     Review of Systems  Constitutional: Negative for chills and fever.  HENT: Negative for congestion and ear pain.   Eyes: Negative for pain and redness.  Respiratory: Negative for cough and shortness of breath.   Cardiovascular: Negative for chest pain, palpitations and leg swelling.  Gastrointestinal: Negative for abdominal pain, blood in stool, constipation, diarrhea, nausea and vomiting.  Genitourinary: Negative for dysuria.  Musculoskeletal: Negative for falls and myalgias.  Skin: Negative for rash.  Neurological: Negative for dizziness.  Psychiatric/Behavioral:  Negative for depression. The patient is not nervous/anxious.       Past Medical History:  Diagnosis Date  . Bell's palsy complicating pregnancy    L facial weakness. Present for a week  . GERD (gastroesophageal reflux disease)   . Mental disorder   . No pertinent past medical history   . Panic attacks   . Ulcer     reports that she has never smoked. She has never used smokeless tobacco. She reports that she does not drink alcohol or use drugs.   Current Outpatient Medications:  .  vitamin C (ASCORBIC ACID) 500 MG tablet, Take 500 mg by mouth daily., Disp: , Rfl:  .  atorvastatin (LIPITOR) 10 MG tablet, Take 1 tablet (10 mg total) by mouth daily. (Patient not taking: Reported on 07/17/2019), Disp: 90 tablet, Rfl: 3 .  Calcium Carbonate-Vit D-Min (CALCIUM 1200 PO), Take by mouth., Disp: , Rfl:    Observations/Objective: Blood pressure (!) 80/60, pulse 73, temperature 98 F (36.7 C), temperature source Temporal, height 5\' 1"  (1.549 m), weight 109 lb 4 oz (49.6 kg), last menstrual period 07/01/2019, SpO2 100 %.  Physical Exam Constitutional:      General: She is not in acute distress.    Appearance: Normal appearance. She is well-developed. She is not ill-appearing or toxic-appearing.  HENT:     Head: Normocephalic.     Right Ear: Hearing, tympanic membrane, ear canal and external ear normal. Tympanic membrane is not erythematous, retracted or bulging.     Left Ear: Hearing, tympanic membrane, ear canal and  external ear normal. Tympanic membrane is not erythematous, retracted or bulging.     Nose: No mucosal edema or rhinorrhea.     Right Sinus: No maxillary sinus tenderness or frontal sinus tenderness.     Left Sinus: No maxillary sinus tenderness or frontal sinus tenderness.     Mouth/Throat:     Pharynx: Uvula midline.  Eyes:     General: Lids are normal. Lids are everted, no foreign bodies appreciated.     Conjunctiva/sclera: Conjunctivae normal.     Pupils: Pupils are equal,  round, and reactive to light.  Neck:     Thyroid: No thyroid mass or thyromegaly.     Vascular: No carotid bruit.     Trachea: Trachea normal.  Cardiovascular:     Rate and Rhythm: Normal rate and regular rhythm.     Pulses: Normal pulses.     Heart sounds: Normal heart sounds, S1 normal and S2 normal. No murmur. No friction rub. No gallop.   Pulmonary:     Effort: Pulmonary effort is normal. No tachypnea or respiratory distress.     Breath sounds: Normal breath sounds. No decreased breath sounds, wheezing, rhonchi or rales.  Abdominal:     General: Bowel sounds are normal.     Palpations: Abdomen is soft.     Tenderness: There is abdominal tenderness in the epigastric area. There is no right CVA tenderness, left CVA tenderness, guarding or rebound.  Musculoskeletal:     Cervical back: Normal range of motion and neck supple.  Skin:    General: Skin is warm and dry.     Findings: No rash.  Neurological:     Mental Status: She is alert.  Psychiatric:        Mood and Affect: Mood is not anxious or depressed.        Speech: Speech normal.        Behavior: Behavior normal. Behavior is cooperative.        Thought Content: Thought content normal.        Judgment: Judgment normal.      Assessment and Plan   Gastroesophageal reflux disease  Start PPI,   Trigger avoidance.   Given some epigastric pain on exam.. eval with cbc and CMET.  High cholesterol Stopped medication... working almost too aggressively on diet.. reviewed healthy high protein low chol diet. Re-eval today.     Eliezer Lofts, MD

## 2019-10-28 IMAGING — CR CHEST - 2 VIEW
2 series · 2 of 2 positions shown · non-contrast
Comparison: 02/27/2018

CLINICAL DATA: Chest and back discomfort

EXAM:
CHEST - 2 VIEW

[chest pa]
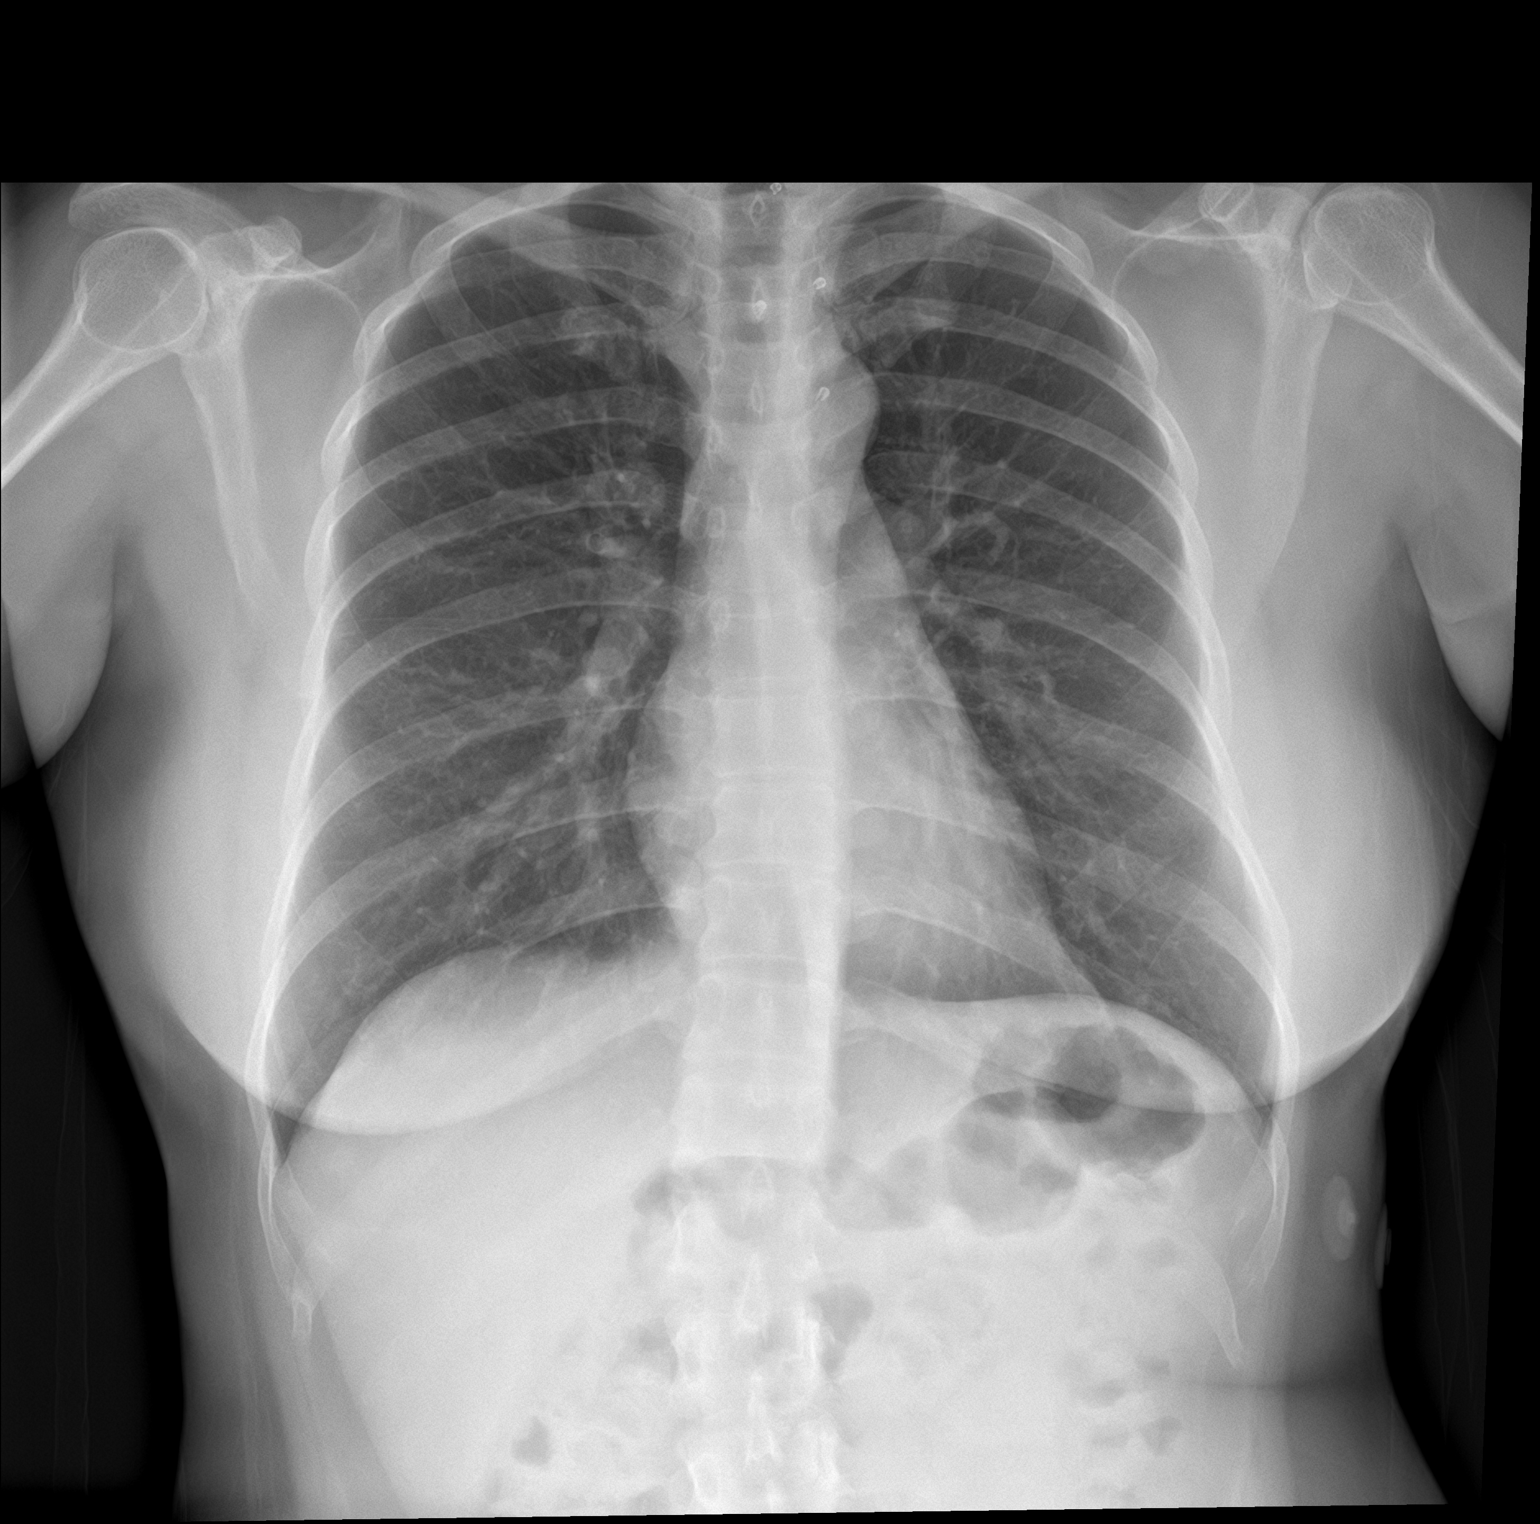

[chest lat]
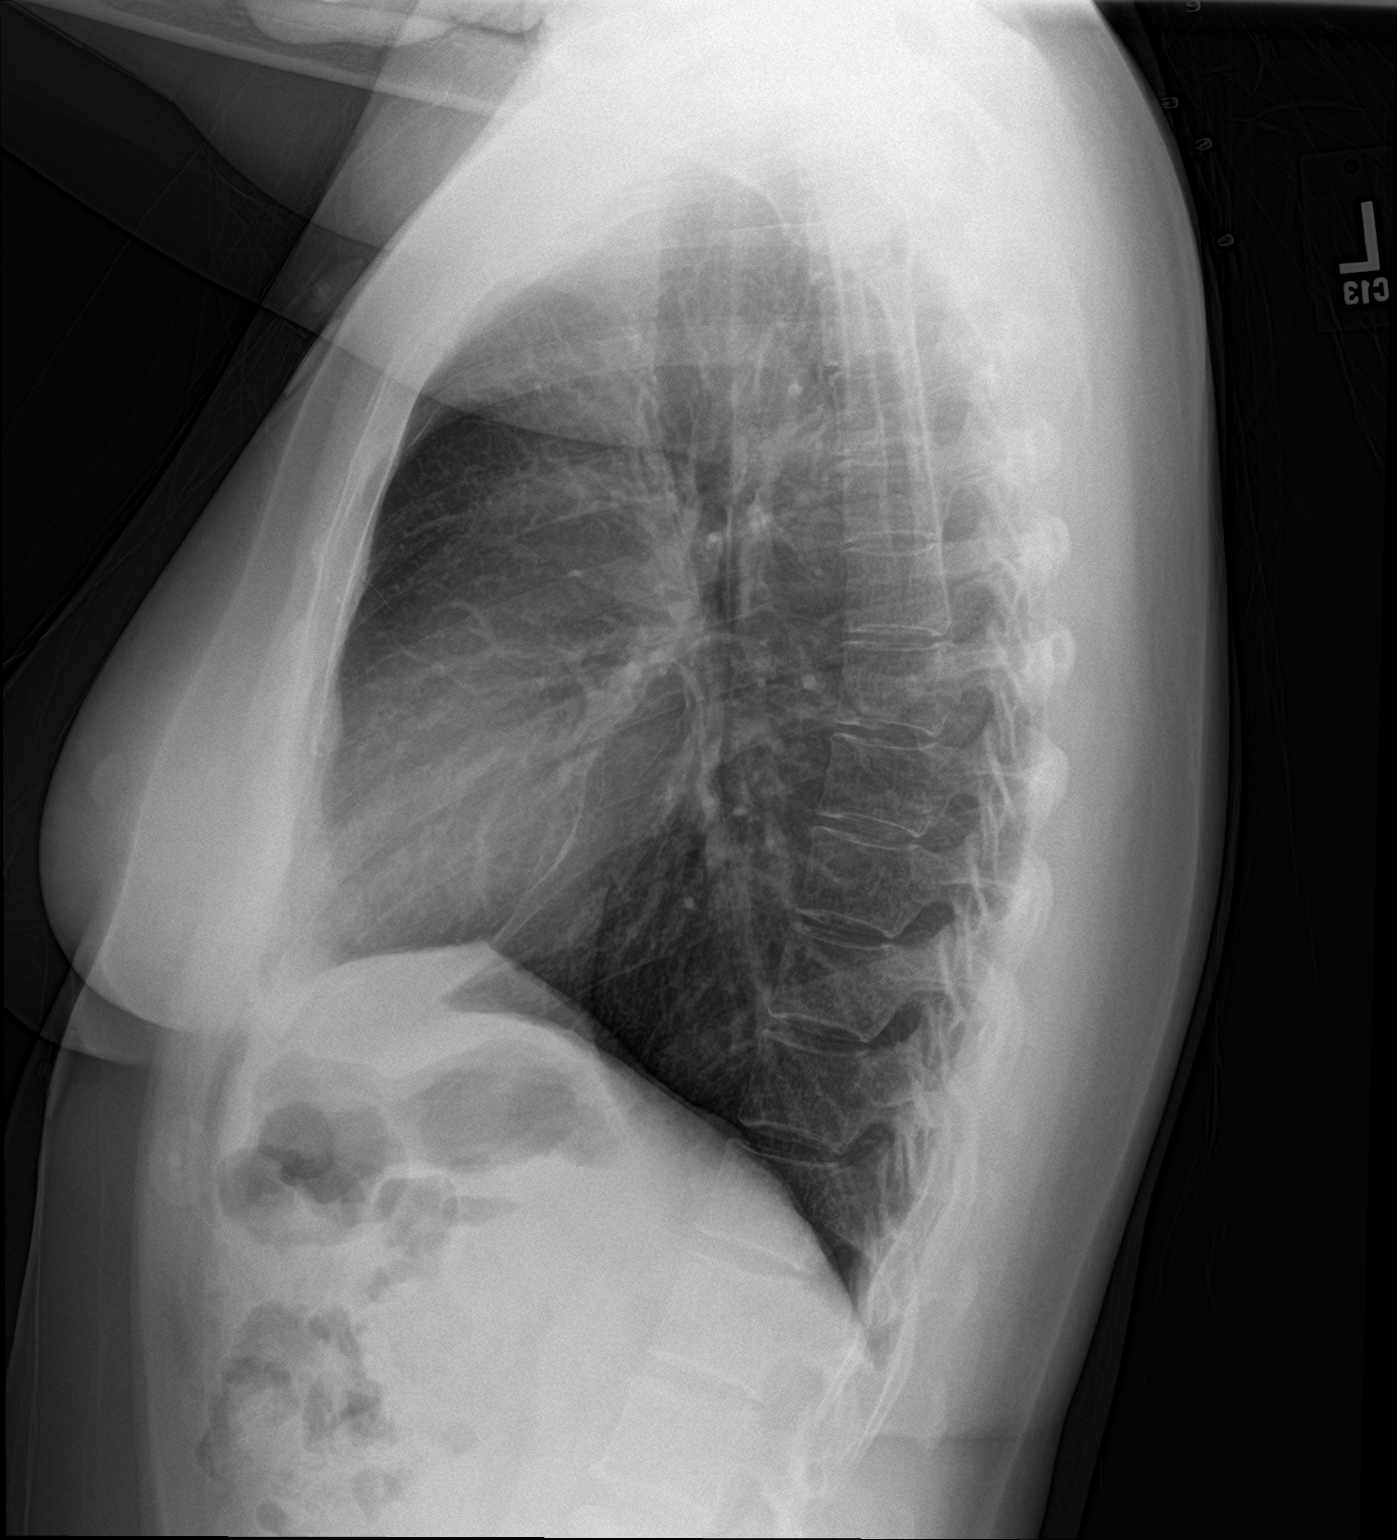

[2 of 2 positions shown; findings below may reference images not displayed]

FINDINGS: The heart size and mediastinal contours are within normal limits.
Both lungs are clear. The visualized skeletal structures are
unremarkable.
IMPRESSION: No acute abnormality of the lungs.

## 2020-02-24 ENCOUNTER — Emergency Department: Payer: 59

## 2020-02-24 ENCOUNTER — Emergency Department
Admission: EM | Admit: 2020-02-24 | Discharge: 2020-02-24 | Disposition: A | Discharge status: Home / Self Care | Payer: 59 | Attending: Emergency Medicine | Admitting: Emergency Medicine

## 2020-02-24 ENCOUNTER — Other Ambulatory Visit: Payer: Self-pay

## 2020-02-24 ENCOUNTER — Telehealth: Payer: Self-pay

## 2020-02-24 DIAGNOSIS — M5412 Radiculopathy, cervical region: Secondary | ICD-10-CM | POA: Insufficient documentation

## 2020-02-24 DIAGNOSIS — R200 Anesthesia of skin: Secondary | ICD-10-CM | POA: Diagnosis present

## 2020-02-24 LAB — URINALYSIS, COMPLETE (UACMP) WITH MICROSCOPIC
Bacteria, UA: NONE SEEN
Bilirubin Urine: NEGATIVE
Glucose, UA: NEGATIVE mg/dL
Ketones, ur: NEGATIVE mg/dL
Nitrite: NEGATIVE
Protein, ur: 100 mg/dL — AB
RBC / HPF: >50 RBC/hpf — ABNORMAL HIGH (ref 0–5)
Specific Gravity, Urine: 1.015 (ref 1.005–1.030)
WBC, UA: >50 WBC/hpf — ABNORMAL HIGH (ref 0–5)
pH: 6 (ref 5.0–8.0)

## 2020-02-24 LAB — CBC
HCT: 40.7 % (ref 36.0–46.0)
Hemoglobin: 13.6 g/dL (ref 12.0–15.0)
MCH: 26.4 pg (ref 26.0–34.0)
MCHC: 33.4 g/dL (ref 30.0–36.0)
MCV: 78.9 fL — ABNORMAL LOW (ref 80.0–100.0)
Platelets: 323 10*3/uL (ref 150–400)
RBC: 5.16 MIL/uL — ABNORMAL HIGH (ref 3.87–5.11)
RDW: 12.6 % (ref 11.5–15.5)
WBC: 6.7 10*3/uL (ref 4.0–10.5)
nRBC: 0 % (ref 0.0–0.2)

## 2020-02-24 LAB — BASIC METABOLIC PANEL
Anion gap: 9 (ref 5–15)
BUN: 6 mg/dL (ref 6–20)
CO2: 24 mmol/L (ref 22–32)
Calcium: 9.1 mg/dL (ref 8.9–10.3)
Chloride: 104 mmol/L (ref 98–111)
Creatinine, Ser: 0.45 mg/dL (ref 0.44–1.00)
GFR calc Af Amer: >60 mL/min (ref >60)
GFR calc non Af Amer: >60 mL/min (ref >60)
Glucose, Bld: 106 mg/dL — ABNORMAL HIGH (ref 70–99)
Potassium: 3.7 mmol/L (ref 3.5–5.1)
Sodium: 137 mmol/L (ref 135–145)

## 2020-02-24 LAB — POCT PREGNANCY, URINE: Preg Test, Ur: NEGATIVE

## 2020-02-24 MED ORDER — LIDOCAINE VISCOUS HCL 2 % MT SOLN
15.0000 mL | Freq: Once | OROMUCOSAL | Status: AC
  Administered 2020-02-24: 15 mL via ORAL
  Filled 2020-02-24: qty 15

## 2020-02-24 MED ORDER — ALUM & MAG HYDROXIDE-SIMETH 200-200-20 MG/5ML PO SUSP
30.0000 mL | Freq: Once | ORAL | Status: AC
  Administered 2020-02-24: 30 mL via ORAL
  Filled 2020-02-24: qty 30

## 2020-02-24 NOTE — Telephone Encounter (Signed)
Per chart review tab pt is at [facility] ED now. 

## 2020-02-24 NOTE — ED Triage Notes (Signed)
First nurse note- arm numbness since Friday.  Unlabored. Ambulatory

## 2020-02-24 NOTE — Telephone Encounter (Signed)
[facility] Day - Client TELEPHONE ADVICE RECORD [facility] Patient Name: Felicia Mccoy Gender: Female DOB: Oct 08, 1975 Age: 44 Y 6 M 20 D Return Phone Number: [phone] (Primary), [phone] (Secondary) Address: City/State/Zip: [place] [address] Client [facility] Day - Client Client Site [facility] - Day Physician Kerby Nora - MD Contact Type Call Who Is Calling Patient / Member / Family / Caregiver Call Type Triage / Clinical Relationship To Patient Self Return Phone Number [phone] (Primary) Chief Complaint NUMBNESS/TINGLING- sudden on one side of the body or face Reason for Call Symptomatic / Request for Health Information Initial Comment Caller states, pt having left arm pain and feeling like congestion/ mucus in throat. Has numbness in left arm. Translation No Nurse Assessment Nurse: Hermelinda Dellen, RN, Brandi Date/Time (Eastern Time): 02/24/2020 8:49:56 AM Confirm and document reason for call. If symptomatic, describe symptoms. ---Caller states, pt having left arm pain and feeling like congestion/ mucus in throat. Has numbness in left arm. Has the patient had close contact with a person known or suspected to have the novel coronavirus illness OR traveled / lives in area with major community spread (including international travel) in the last 14 days from the onset of symptoms? * If Asymptomatic, screen for exposure and travel within the last 14 days. ---No Does the patient have any new or worsening symptoms? ---Yes Will a triage be completed? ---Yes Related visit to physician within the last 2 weeks? ---No Does the PT have any chronic conditions? (i.e. diabetes, asthma, this includes High risk factors for pregnancy, etc.) ---Unknown Is the patient pregnant or possibly pregnant? (Ask all females between the ages of 46-55) ---No Is this a behavioral health or substance abuse call?  ---No Guidelines Guideline Title Affirmed Question Affirmed Notes Nurse Date/Time Lamount Cohen Time) Neurologic Deficit Sounds like a lifethreatening emergency to the triager Hermelinda Dellen, RN, Brandi 02/24/2020 8:50:19 AM PLEASE NOTE: All timestamps contained within this report are represented as [place] Standard Time. CONFIDENTIALTY NOTICE: This fax transmission is intended only for the addressee. It contains information that is legally privileged, confidential or otherwise protected from use or disclosure. If you are not the intended recipient, you are strictly prohibited from reviewing, disclosing, copying using or disseminating any of this information or taking any action in reliance on or regarding this information. If you have received this fax in error, please notify us immediately by telephone so that we can arrange for its return to [place]. Phone: [phone], Toll-Free: [phone], Fax: [phone] Page: 2 of 2 Call Id: [id number] Disp. Time Lamount Cohen Time) Disposition Final User 02/24/2020 8:46:52 AM Send to Urgent Queue Terrilee Files 02/24/2020 8:56:43 AM 911 Outcome Documentation Hermelinda Dellen, RN, Merry Proud Reason: Refused 02/24/2020 8:52:38 AM Call EMS 911 Now Yes Hermelinda Dellen, RN, Sherian Maroon Disagree/Comply Disagree Caller Understands Yes PreDisposition Call a family member Care Advice Given Per Guideline CALL EMS 911 NOW: * Immediate medical attention is needed. You need to hang up and call 911 (or an ambulance). * Triager Discretion: I'll call you back in a few minutes to be sure you were able to reach them. Comments User: Marinda Elk, RN Date/Time Lamount Cohen Time): 02/24/2020 8:56:25 AM Caller reports she is at work. States Friday pain started in left arm and shoulder reprots she started sweating a lot and took a shower to try and help. reports he rested. States left arm and shoulder numb with new clearing of the throat. States she had dizzinesss and weakness this am. Refuses EMS  reports she will call her husband to to take her to ED. Referrals GO TO FACILITY UNDECIDED

## 2020-02-24 NOTE — ED Provider Notes (Signed)
[facility] Emergency Department Provider Note   ____________________________________________    I have reviewed the triage vital signs and the nursing notes.   HISTORY  Chief Complaint Numbness     HPI Felicia Mccoy is a 44 y.o. female who presents with complaints of left shoulder and neck pain and tingling in her hand, left greater than right.  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  She denies headaches, no nausea or vomiting.  Reports symptoms started 4 days prior to arrival.  She did feel lightheaded at one time as well.  No chest pain currently.  No shortness of breath.  No pleurisy.  No injury to the area  Past Medical History:  Diagnosis Date  . Bell's palsy complicating pregnancy    L facial weakness. Present for a week  . GERD (gastroesophageal reflux disease)   . Mental disorder   . No pertinent past medical history   . Panic attacks   . Ulcer     Patient Active Problem List   Diagnosis Date Noted  . SOB (shortness of breath) on exertion 12/26/2018  . Fatigue 12/26/2018  . High cholesterol 12/26/2018  . 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 History of peptic ulcer disease 12/20/2007  . Gastroesophageal reflux disease 05/22/2007    Past Surgical History:  Procedure Laterality Date  . CESAREAN SECTION  05/13/2011   Procedure: CESAREAN SECTION;  Surgeon: Robley Fries;  Location: [facility];  Service: Gynecology;  Laterality: N/A;  . NO PAST SURGERIES      Prior to Admission medications   Medication Sig Start Date End Date Taking? Authorizing Provider  Calcium Carbonate-Vit D-Min (CALCIUM 1200 PO) Take by mouth.    [provider]     Allergies Nubain [nalbuphine hcl] and Stadol [butorphanol tartrate]  History reviewed. No pertinent family history.  Social History Social History   Tobacco Use  . Smoking  status: Never Smoker  . Smokeless tobacco: Never Used  Vaping Use  . Vaping Use: Never used  Substance Use Topics  . Alcohol use: No  . Drug use: No    Review of Systems  Constitutional: No fever/chills Eyes: No visual changes.  ENT: No sore throat. Cardiovascular: Denies chest pain. Respiratory: Denies shortness of breath. Gastrointestinal: No abdominal pain.  No nausea, no vomiting.   Genitourinary: Negative for dysuria. Musculoskeletal: As above Skin: Negative for rash. Neurological: As above   ____________________________________________   PHYSICAL EXAM:  VITAL SIGNS: ED Triage Vitals  Enc Vitals Group     BP 02/24/20 1032 121/84     Pulse Rate 02/24/20 1032 98     Resp 02/24/20 1032 16     Temp 02/24/20 1032 98.2 F (36.8 C)     Temp Source 02/24/20 1032 Oral     SpO2 02/24/20 1032 100 %     Weight 02/24/20 1033 50.3 kg (111 lb)     Height 02/24/20 1033 1.575 m (5\' 2" )     Head Circumference --      Peak Flow --      Pain Score 02/24/20 1032 9     Pain Loc --      Pain Edu? --      Excl. in GC? --     Constitutional: Alert and oriented. No acute distress.  Eyes: Conjunctivae are normal.  Head: Atraumatic. Nose: No congestion/rhinnorhea. Mouth/Throat: Mucous membranes are  moist.   Neck:  Painless ROM, mild discomfort along the left border of the posterior neck Cardiovascular: Normal rate, regular rhythm. Grossly normal heart sounds.  Good peripheral circulation.  Pulses equal bilaterally in the upper extremities Respiratory: Normal respiratory effort.  No retractions. Lungs CTAB. Gastrointestinal: Soft and nontender. No distention.  No CVA tenderness.  Musculoskeletal: No lower extremity tenderness nor edema.  Warm and well perfused Neurologic:  Normal speech and language. No gross focal neurologic deficits are appreciated.  Normal strength in all extremities, cranial nerves II 12 normal Skin:  Skin is warm, dry and intact. No rash noted. Psychiatric:  Mood and affect are normal. Speech and behavior are normal.  ____________________________________________   LABS (all labs ordered are listed, but only abnormal results are displayed)  Labs Reviewed  BASIC METABOLIC PANEL - Abnormal; Notable for the following components:      Result Value   Glucose, Bld 106 (*)    All other components within normal limits  CBC - Abnormal; Notable for the following components:   RBC 5.16 (*)    MCV 78.9 (*)    All other components within normal limits  URINALYSIS, COMPLETE (UACMP) WITH MICROSCOPIC - Abnormal; Notable for the following components:   Color, Urine ORANGE (*)    APPearance CLOUDY (*)    Hgb urine dipstick LARGE (*)    Protein, ur 100 (*)    Leukocytes,Ua SMALL (*)    RBC / HPF >50 (*)    WBC, UA >50 (*)    All other components within normal limits  POCT PREGNANCY, URINE  POC URINE PREG, ED   ____________________________________________  EKG  ED ECG REPORT I, Jene Every, the attending physician, personally viewed and interpreted this ECG.  Date: 02/24/2020  Rhythm: normal sinus rhythm QRS Axis: normal Intervals: normal ST/T Wave abnormalities: normal Narrative Interpretation: no evidence of acute ischemia  ____________________________________________  RADIOLOGY  CT head ordered in triage without evidence of CVA or abnormality ____________________________________________   PROCEDURES  Procedure(s) performed: No  Procedures   Critical Care performed: No ____________________________________________   INITIAL IMPRESSION / ASSESSMENT AND PLAN / ED COURSE  Pertinent labs & imaging results that were available during my care of the patient were reviewed by me and considered in my medical decision making (see chart for details).  Patient well-appearing and in no acute distress.  Primarily complains of tingling in her fingers on the left with pain in her left shoulder and neck, suspicious for cervical  radiculopathy.  Carpal tunnel is also on the differential, less likely CVA.  CT head is reassuring.  Patient reports paresthesias worsen with axial load on left arm suspicious for cervical radiculopathy.  Lab work is overall reassuring, she is currently menstruating.  Recommended further imaging however patient declined as she thinks that she is feeling better and would like to go home, she will follow-up with PCP, she knows she can return anytime.    ____________________________________________   FINAL CLINICAL IMPRESSION(S) / ED DIAGNOSES  Final diagnoses:  Cervical radiculopathy        Note:  This document was prepared using Dragon voice recognition software and may include unintentional dictation errors.   Jene Every, MD 02/24/20 2131

## 2020-02-24 NOTE — ED Notes (Signed)
Patient discharged home, patient received discharge papers. Patient appropriate and cooperative. Vital signs taken. NAD noted. 

## 2020-02-24 NOTE — ED Triage Notes (Signed)
Pt arrives to ER for L arm numbness since Friday. Also c/o sore throat when swallowing. Pt points to shoulder blade and shoulder and c/o tingling in fingers and feet. Able to move L arm and fingers. No distress noted. A&O, speech clear, ambulatory.

## 2020-02-24 NOTE — ED Notes (Signed)
Pt unable to drink GI cocktail

## 2020-02-24 NOTE — ED Notes (Signed)
Pt reports numbness in her left arm and tingling in her fingers x5 days. Pt denies any other symptoms or pain. Pt also reports trouble swallowing, feeling like there is food stuck in her throat.

## 2020-11-05 ENCOUNTER — Telehealth: Payer: Self-pay | Admitting: Family Medicine

## 2020-11-05 DIAGNOSIS — E7800 Pure hypercholesterolemia, unspecified: Secondary | ICD-10-CM

## 2020-11-05 NOTE — Telephone Encounter (Signed)
-----   Message from Alvina Chou sent at 10/26/2020 12:50 PM EDT ----- Regarding: Lab orders for Thursday, 5.5.22 Patient is scheduled for CPX labs, please order future labs, Thanks , Camelia Eng

## 2020-11-05 NOTE — Telephone Encounter (Signed)
-----   Message from Alvina Chou sent at 10/26/2020 12:52 PM EDT ----- Regarding: Lab orders for Thursday, 5.5.22 Patient is scheduled for CPX labs, please order future labs, Thanks , Camelia Eng

## 2020-11-11 ENCOUNTER — Other Ambulatory Visit: Payer: Self-pay

## 2020-11-11 ENCOUNTER — Other Ambulatory Visit (INDEPENDENT_AMBULATORY_CARE_PROVIDER_SITE_OTHER): Payer: 59

## 2020-11-11 DIAGNOSIS — E7800 Pure hypercholesterolemia, unspecified: Secondary | ICD-10-CM

## 2020-11-11 LAB — LIPID PANEL
Cholesterol: 212 mg/dL — ABNORMAL HIGH (ref 0–200)
HDL: 63.9 mg/dL (ref >39.00)
LDL Cholesterol: 131 mg/dL — ABNORMAL HIGH (ref 0–99)
NonHDL: 147.85
Total CHOL/HDL Ratio: 3
Triglycerides: 86 mg/dL (ref 0.0–149.0)
VLDL: 17.2 mg/dL (ref 0.0–40.0)

## 2020-11-11 LAB — COMPREHENSIVE METABOLIC PANEL
ALT: 18 U/L (ref 0–35)
AST: 22 U/L (ref 0–37)
Albumin: 4.5 g/dL (ref 3.5–5.2)
Alkaline Phosphatase: 61 U/L (ref 39–117)
BUN: 12 mg/dL (ref 6–23)
CO2: 27 mEq/L (ref 19–32)
Calcium: 9.2 mg/dL (ref 8.4–10.5)
Chloride: 103 mEq/L (ref 96–112)
Creatinine, Ser: 0.56 mg/dL (ref 0.40–1.20)
GFR: 111.9 mL/min (ref >60.00)
Glucose, Bld: 96 mg/dL (ref 70–99)
Potassium: 3.9 mEq/L (ref 3.5–5.1)
Sodium: 138 mEq/L (ref 135–145)
Total Bilirubin: 0.5 mg/dL (ref 0.2–1.2)
Total Protein: 7.8 g/dL (ref 6.0–8.3)

## 2020-11-12 NOTE — Progress Notes (Signed)
No critical labs need to be addressed urgently. We will discuss labs in detail at upcoming office visit.   

## 2020-11-18 ENCOUNTER — Other Ambulatory Visit: Payer: Self-pay

## 2020-11-18 ENCOUNTER — Ambulatory Visit (INDEPENDENT_AMBULATORY_CARE_PROVIDER_SITE_OTHER): Payer: 59 | Admitting: Family Medicine

## 2020-11-18 ENCOUNTER — Encounter: Payer: Self-pay | Admitting: Family Medicine

## 2020-11-18 VITALS — BP 92/70 | HR 78 | Temp 97.9°F | Ht 60.75 in | Wt 114.0 lb

## 2020-11-18 DIAGNOSIS — Z789 Other specified health status: Secondary | ICD-10-CM | POA: Diagnosis not present

## 2020-11-18 DIAGNOSIS — Z1159 Encounter for screening for other viral diseases: Secondary | ICD-10-CM | POA: Diagnosis not present

## 2020-11-18 DIAGNOSIS — Z0000 Encounter for general adult medical examination without abnormal findings: Secondary | ICD-10-CM

## 2020-11-18 DIAGNOSIS — R202 Paresthesia of skin: Secondary | ICD-10-CM | POA: Diagnosis not present

## 2020-11-18 NOTE — Patient Instructions (Addendum)
Can eat eggs or whites as well as cereals for B12 and protein. Send date for COVID vaccines on MChart.  Call if interested in mammogram.

## 2020-11-18 NOTE — Progress Notes (Signed)
Patient ID: Felicia Mccoy, female    DOB: 01/22/1976, 45 y.o.   MRN: [id number]  This visit was conducted in person.  BP 92/70   Pulse 78   Temp 97.9 F (36.6 C) (Temporal)   Ht 5' 0.75" (1.543 m)   Wt 114 lb (51.7 kg)   LMP 11/12/2020   SpO2 99%   BMI 21.72 kg/m    CC: Chief Complaint  Patient presents with  . Annual Exam     Subjective:   HPI: Felicia Mccoy is a 45 y.o. female presenting on 11/18/2020 for Annual Exam   Dx with cervical radiculopathy.. numbness in left arm. Pain in shoulder b;ade and pins and needles in arm.  Did have chest pain at same time as well.. saw cardiology... told GER 02/2020  Normal EKG, labs, head CT.  told    She is having hair loss because she is restricting her diet to avoid GERD... this has improved with going back to normal diet. No further weight loss. She feels better taking b12 1000 mcg daily  Wt Readings from Last 3 Encounters:  11/18/20 114 lb (51.7 kg)  02/24/20 111 lb (50.3 kg)  07/17/19 109 lb 4 oz (49.6 kg)     .Elevated Cholesterol:  Lab Results  Component Value Date   CHOL 212 (H) 11/11/2020   HDL 63.90 11/11/2020   LDLCALC 131 (H) 11/11/2020   LDLDIRECT 153.3 12/03/2012   TRIG 86.0 11/11/2020   CHOLHDL 3 11/11/2020  The 10-year ASCVD risk score Denman George DC Jr., et al., 2013) is: 0.3%   Values used to calculate the score:     Age: 64 years     Sex: Female     Is Non-Hispanic African American: No     Diabetic: No     Tobacco smoker: No     Systolic Blood Pressure: 92 mmHg     Is BP treated: No     HDL Cholesterol: 63.9 mg/dL     Total Cholesterol: 212 mg/dL  Using medications without problems: Muscle aches:  Diet compliance: Exercise: Other complaints:       Relevant past medical, surgical, family and social history reviewed and updated as indicated. Interim medical history since our last visit reviewed. Allergies and medications reviewed and updated. Outpatient Medications Prior to Visit   Medication Sig Dispense Refill  . vitamin B-12 (CYANOCOBALAMIN) 1000 MCG tablet Take 1,000 mcg by mouth every other day.    . Calcium Carbonate-Vit D-Min (CALCIUM 1200 PO) Take by mouth.     No facility-administered medications prior to visit.     Per HPI unless specifically indicated in ROS section below Review of Systems  Constitutional: Negative for fatigue and fever.  HENT: Negative for congestion.   Eyes: Negative for pain.  Respiratory: Negative for cough and shortness of breath.   Cardiovascular: Positive for chest pain. Negative for palpitations and leg swelling.  Gastrointestinal: Negative for abdominal pain.  Genitourinary: Negative for dysuria and vaginal bleeding.  Musculoskeletal: Positive for neck pain. Negative for back pain.  Neurological: Positive for numbness. Negative for syncope, light-headedness and headaches.  Psychiatric/Behavioral: Negative for dysphoric mood.   Objective:  BP 92/70   Pulse 78   Temp 97.9 F (36.6 C) (Temporal)   Ht 5' 0.75" (1.543 m)   Wt 114 lb (51.7 kg)   LMP 11/12/2020   SpO2 99%   BMI 21.72 kg/m   Wt Readings from Last 3 Encounters:  11/18/20 114 lb (51.7 kg)  02/24/20 111  lb (50.3 kg)  07/17/19 109 lb 4 oz (49.6 kg)      Physical Exam Constitutional:      General: She is not in acute distress.    Appearance: Normal appearance. She is well-developed. She is not ill-appearing or toxic-appearing.  HENT:     Head: Normocephalic.     Right Ear: Hearing, tympanic membrane, ear canal and external ear normal. Tympanic membrane is not erythematous, retracted or bulging.     Left Ear: Hearing, tympanic membrane, ear canal and external ear normal. Tympanic membrane is not erythematous, retracted or bulging.     Nose: No mucosal edema or rhinorrhea.     Right Sinus: No maxillary sinus tenderness or frontal sinus tenderness.     Left Sinus: No maxillary sinus tenderness or frontal sinus tenderness.     Mouth/Throat:     Pharynx:  Uvula midline.  Eyes:     General: Lids are normal. Lids are everted, no foreign bodies appreciated.     Conjunctiva/sclera: Conjunctivae normal.     Pupils: Pupils are equal, round, and reactive to light.  Neck:     Thyroid: No thyroid mass or thyromegaly.     Vascular: No carotid bruit.     Trachea: Trachea normal.  Cardiovascular:     Rate and Rhythm: Normal rate and regular rhythm.     Pulses: Normal pulses.     Heart sounds: Normal heart sounds, S1 normal and S2 normal. No murmur heard. No friction rub. No gallop.   Pulmonary:     Effort: Pulmonary effort is normal. No tachypnea or respiratory distress.     Breath sounds: Normal breath sounds. No decreased breath sounds, wheezing, rhonchi or rales.  Abdominal:     General: Bowel sounds are normal.     Palpations: Abdomen is soft.     Tenderness: There is no abdominal tenderness.  Musculoskeletal:     Cervical back: Normal range of motion and neck supple.  Skin:    General: Skin is warm and dry.     Findings: No rash.  Neurological:     Mental Status: She is alert and oriented to person, place, and time.     GCS: GCS eye subscore is 4. GCS verbal subscore is 5. GCS motor subscore is 6.     Cranial Nerves: No cranial nerve deficit.     Sensory: No sensory deficit.     Motor: No abnormal muscle tone.     Coordination: Coordination normal.     Gait: Gait normal.     Deep Tendon Reflexes: Reflexes are normal and symmetric.     Comments: Nml cerebellar exam   No papilledema  Psychiatric:        Mood and Affect: Mood is not anxious or depressed.        Speech: Speech normal.        Behavior: Behavior normal. Behavior is cooperative.        Thought Content: Thought content normal.        Cognition and Memory: Memory is not impaired. She does not exhibit impaired recent memory or impaired remote memory.        Judgment: Judgment normal.       Results for orders placed or performed in visit on 11/11/20  Comprehensive  metabolic panel  Result Value Ref Range   Sodium 138 135 - 145 mEq/L   Potassium 3.9 3.5 - 5.1 mEq/L   Chloride 103 96 - 112 mEq/L   CO2 27 19 -  32 mEq/L   Glucose, Bld 96 70 - 99 mg/dL   BUN 12 6 - 23 mg/dL   Creatinine, Ser 1.60 0.40 - 1.20 mg/dL   Total Bilirubin 0.5 0.2 - 1.2 mg/dL   Alkaline Phosphatase 61 39 - 117 U/L   AST 22 0 - 37 U/L   ALT 18 0 - 35 U/L   Total Protein 7.8 6.0 - 8.3 g/dL   Albumin 4.5 3.5 - 5.2 g/dL   GFR 737.10 >62.69 mL/min   Calcium 9.2 8.4 - 10.5 mg/dL  Lipid panel  Result Value Ref Range   Cholesterol 212 (H) 0 - 200 mg/dL   Triglycerides 48.5 0.0 - 149.0 mg/dL   HDL 46.27 >03.50 mg/dL   VLDL 09.3 0.0 - 81.8 mg/dL   LDL Cholesterol 299 (H) 0 - 99 mg/dL   Total CHOL/HDL Ratio 3    NonHDL 147.85     This visit occurred during the SARS-CoV-2 public health emergency.  Safety protocols were in place, including screening questions prior to the visit, additional usage of staff PPE, and extensive cleaning of exam room while observing appropriate contact time as indicated for disinfecting solutions.   COVID 19 screen:  No recent travel or known exposure to COVID19 The patient denies respiratory symptoms of COVID 19 at this time. The importance of social distancing was discussed today.   Assessment and Plan   The patient's preventative maintenance and recommended screening tests for an annual wellness exam were reviewed in full today. Brought up to date unless services declined.  Counselled on the importance of diet, exercise, and its role in overall health and mortality. The patient's FH and SH was reviewed, including their home life, tobacco status, and drug and alcohol status.   Vaccines: refused  td vaccine.  COVID x 2 Pap/DVE:  GYN 2017.. per pt pap done 2020... will get results Mammo:   due .. she will consider Colon: no early family history Smoking Status:none ETOH/ drug use: none HIV screen:  refused. Hep C: due  Problem List Items  Addressed This Visit   None   Visit Diagnoses    Vegetarian diet    -  Primary   Relevant Orders   CBC with Differential/Platelet   IBC + Ferritin   TSH   Vitamin B12   Tingling       Relevant Orders   CBC with Differential/Platelet   IBC + Ferritin   TSH   Vitamin B12   Need for hepatitis C screening test       Relevant Orders   Hepatitis C antibody      Kerby Nora, MD

## 2020-11-19 ENCOUNTER — Other Ambulatory Visit (INDEPENDENT_AMBULATORY_CARE_PROVIDER_SITE_OTHER): Payer: 59

## 2020-11-19 ENCOUNTER — Other Ambulatory Visit: Payer: Self-pay

## 2020-11-19 DIAGNOSIS — R202 Paresthesia of skin: Secondary | ICD-10-CM

## 2020-11-19 DIAGNOSIS — Z789 Other specified health status: Secondary | ICD-10-CM | POA: Diagnosis not present

## 2020-11-19 DIAGNOSIS — Z1159 Encounter for screening for other viral diseases: Secondary | ICD-10-CM

## 2020-11-19 LAB — CBC WITH DIFFERENTIAL/PLATELET
Basophils Absolute: 0 10*3/uL (ref 0.0–0.1)
Basophils Relative: 0.8 % (ref 0.0–3.0)
Eosinophils Absolute: 0.1 10*3/uL (ref 0.0–0.7)
Eosinophils Relative: 1.9 % (ref 0.0–5.0)
HCT: 38.9 % (ref 36.0–46.0)
Hemoglobin: 13 g/dL (ref 12.0–15.0)
Lymphocytes Relative: 32.2 % (ref 12.0–46.0)
Lymphs Abs: 1.6 10*3/uL (ref 0.7–4.0)
MCHC: 33.4 g/dL (ref 30.0–36.0)
MCV: 78.8 fl (ref 78.0–100.0)
Monocytes Absolute: 0.5 10*3/uL (ref 0.1–1.0)
Monocytes Relative: 9.6 % (ref 3.0–12.0)
Neutro Abs: 2.8 10*3/uL (ref 1.4–7.7)
Neutrophils Relative %: 55.5 % (ref 43.0–77.0)
Platelets: 275 10*3/uL (ref 150.0–400.0)
RBC: 4.94 Mil/uL (ref 3.87–5.11)
RDW: 13.2 % (ref 11.5–15.5)
WBC: 5 10*3/uL (ref 4.0–10.5)

## 2020-11-19 LAB — VITAMIN B12: Vitamin B-12: 1147 pg/mL — ABNORMAL HIGH (ref 211–911)

## 2020-11-19 LAB — IBC + FERRITIN
Ferritin: 41.8 ng/mL (ref 10.0–291.0)
Iron: 129 ug/dL (ref 42–145)
Saturation Ratios: 34.8 % (ref 20.0–50.0)
Transferrin: 265 mg/dL (ref 212.0–360.0)

## 2020-11-19 LAB — TSH: TSH: 1.54 u[IU]/mL (ref 0.35–4.50)

## 2020-11-22 LAB — HEPATITIS C ANTIBODY
Hepatitis C Ab: NONREACTIVE
SIGNAL TO CUT-OFF: 0.02 (ref <1.00)

## 2021-05-09 ENCOUNTER — Telehealth: Payer: Self-pay

## 2021-05-09 NOTE — Telephone Encounter (Signed)
[facility] - Client TELEPHONE ADVICE RECORD [facility] Patient Name: [facility] RO BERTSON Gender: Female DOB: [birth year] Age: 45 Y 9 M 3 D Return Phone Number: [phone] (Primary), [phone] (Secondary) Address: City/ State/ Zip: [place] [address] Client [facility] - Client Client Site [facility] - Day Physician Kerby Nora - MD Contact Type Call Who Is Calling Patient / Member / Family / Caregiver Call Type Triage / Clinical Relationship To Patient Self Return Phone Number [phone] (Primary) Chief Complaint Dizziness Reason for Call Symptomatic / Request for Health Information Initial Comment Caller states she is lightheaded and dizzy for 5 days. Translation No Nurse Assessment Nurse: Clarita Leber, RN, Deborah Date/Time (Eastern Time): 05/09/2021 9:37:32 AM Confirm and document reason for call. If symptomatic, describe symptoms. ---The caller states that she is having dizziness for 5 days. Does the patient have any new or worsening symptoms? ---Yes Will a triage be completed? ---Yes Related visit to physician within the last 2 weeks? ---No Does the PT have any chronic conditions? (i.e. diabetes, asthma, this includes High risk factors for pregnancy, etc.) ---No Is the patient pregnant or possibly pregnant? (Ask all females between the ages of 62-55) ---No Is this a behavioral health or substance abuse call? ---No Guidelines Guideline Title Affirmed Question Affirmed Notes Nurse Date/Time (Eastern Time) Dizziness - Lightheadedness [1] MODERATE dizziness (e.g., interferes with normal activities) AND [2] has NOT been evaluated by physician for this (Exception: dizziness caused by heat exposure, sudden Clarita Leber, RN, Gavin Pound 05/09/2021 9:39:52 AM PLEASE NOTE: All timestamps contained within this report are represented as [place] Standard Time. CONFIDENTIALTY NOTICE: This fax  transmission is intended only for the addressee. It contains information that is legally privileged, confidential or otherwise protected from use or disclosure. If you are not the intended recipient, you are strictly prohibited from reviewing, disclosing, copying using or disseminating any of this information or taking any action in reliance on or regarding this information. If you have received this fax in error, please notify us immediately by telephone so that we can arrange for its return to [place]. Phone: [phone], Toll-Free: [phone], Fax: [phone] Page: 2 of 2 Call Id: [id number] Guidelines Guideline Title Affirmed Question Affirmed Notes Nurse Date/Time Lamount Cohen Time) standing, or poor fluid intake) Disp. Time Lamount Cohen Time) Disposition Final User 05/09/2021 9:26:01 AM Attempt made - message left Clarita Leber, RN, Gavin Pound 05/09/2021 9:48:38 AM See PCP within 24 Hours Yes Clarita Leber, RN, Jetty Duhamel Disagree/Comply Comply Caller Understands Yes PreDisposition Call Doctor Care Advice Given Per Guideline SEE PCP WITHIN 24 HOURS: * Lie down with feet elevated for 1 hour. Referrals REFERRED TO PCP OFFIC

## 2021-05-09 NOTE — Telephone Encounter (Signed)
Called patient she wanted to be seen sooner. Was able to get on with [facility] tomorrow. Reviewed ED/ UC precautions. If any changes she will go for evaluation.

## 2021-05-10 ENCOUNTER — Other Ambulatory Visit: Payer: Self-pay

## 2021-05-10 ENCOUNTER — Ambulatory Visit: Payer: 59 | Admitting: Nurse Practitioner

## 2021-05-10 VITALS — BP 94/60 | HR 80 | Temp 97.9°F | Resp 12 | Ht 60.75 in | Wt 119.5 lb

## 2021-05-10 DIAGNOSIS — R3129 Other microscopic hematuria: Secondary | ICD-10-CM

## 2021-05-10 DIAGNOSIS — I951 Orthostatic hypotension: Secondary | ICD-10-CM | POA: Diagnosis not present

## 2021-05-10 DIAGNOSIS — R42 Dizziness and giddiness: Secondary | ICD-10-CM | POA: Diagnosis not present

## 2021-05-10 DIAGNOSIS — E7800 Pure hypercholesterolemia, unspecified: Secondary | ICD-10-CM

## 2021-05-10 LAB — COMPREHENSIVE METABOLIC PANEL
ALT: 15 U/L (ref 0–35)
AST: 19 U/L (ref 0–37)
Albumin: 4.6 g/dL (ref 3.5–5.2)
Alkaline Phosphatase: 51 U/L (ref 39–117)
BUN: 9 mg/dL (ref 6–23)
CO2: 27 mEq/L (ref 19–32)
Calcium: 9.3 mg/dL (ref 8.4–10.5)
Chloride: 103 mEq/L (ref 96–112)
Creatinine, Ser: 0.56 mg/dL (ref 0.40–1.20)
GFR: 111.51 mL/min (ref >60.00)
Glucose, Bld: 95 mg/dL (ref 70–99)
Potassium: 4 mEq/L (ref 3.5–5.1)
Sodium: 137 mEq/L (ref 135–145)
Total Bilirubin: 0.6 mg/dL (ref 0.2–1.2)
Total Protein: 7.6 g/dL (ref 6.0–8.3)

## 2021-05-10 LAB — POCT URINALYSIS DIP (CLINITEK)
Bilirubin, UA: NEGATIVE
Glucose, UA: NEGATIVE mg/dL
Ketones, POC UA: NEGATIVE mg/dL
Leukocytes, UA: NEGATIVE
Nitrite, UA: NEGATIVE
POC PROTEIN,UA: NEGATIVE
Spec Grav, UA: <=1.005 — AB (ref 1.010–1.025)
Urobilinogen, UA: 0.2 E.U./dL
pH, UA: 6.5 (ref 5.0–8.0)

## 2021-05-10 LAB — URINALYSIS, MICROSCOPIC ONLY
RBC / HPF: NONE SEEN (ref 0–?)
WBC, UA: NONE SEEN (ref 0–?)

## 2021-05-10 LAB — CBC
HCT: 40.5 % (ref 36.0–46.0)
Hemoglobin: 13.4 g/dL (ref 12.0–15.0)
MCHC: 33.1 g/dL (ref 30.0–36.0)
MCV: 78.1 fl (ref 78.0–100.0)
Platelets: 316 10*3/uL (ref 150.0–400.0)
RBC: 5.18 Mil/uL — ABNORMAL HIGH (ref 3.87–5.11)
RDW: 13.2 % (ref 11.5–15.5)
WBC: 6.5 10*3/uL (ref 4.0–10.5)

## 2021-05-10 LAB — LIPID PANEL
Cholesterol: 219 mg/dL — ABNORMAL HIGH (ref 0–200)
HDL: 60.1 mg/dL (ref >39.00)
LDL Cholesterol: 142 mg/dL — ABNORMAL HIGH (ref 0–99)
NonHDL: 158.5
Total CHOL/HDL Ratio: 4
Triglycerides: 83 mg/dL (ref 0.0–149.0)
VLDL: 16.6 mg/dL (ref 0.0–40.0)

## 2021-05-10 LAB — TSH: TSH: 1.41 u[IU]/mL (ref 0.35–5.50)

## 2021-05-10 NOTE — Assessment & Plan Note (Signed)
States history of same, work on is concerned that cholesterol has something to do with this.  Patient asked to check lipid panel as she is fasting today.  Will oblige.  Pending lab results

## 2021-05-10 NOTE — Assessment & Plan Note (Signed)
Seems positional in nature.  Patient is orthostatic think she is hypovolemic in some nature.  Gave recommendations for fluid volume and and signs and symptoms reviewed as when to seek urgent or emergent health care.  Pending lab results

## 2021-05-10 NOTE — Patient Instructions (Addendum)
Nice to see you today You urine shows you are hydrated but the blood pressures are dropping. I want you to add in 1-2 gatorades a day until we get the blood work back to make sure your body can hang onto the fluid. I will call about these results.

## 2021-05-10 NOTE — Progress Notes (Signed)
Acute Office Visit  Subjective:    Patient ID: Felicia Mccoy, female    DOB: [birth year], 45 y.o.   MRN: [id number]  Chief Complaint  Patient presents with   Dizziness    The last 5 days. Gets dizzy with positional changes. Lasts for a few minutes. Hearing some buzzing sensation in ears started about a year ago and at the same time has noticed some numbness in her left arm off and on. Headache present. No nausea or vomiting.    HPI Patient is in today for Dizziness  Symptoms started approx 5 days.  States that it starts in the morning. Does not wake her up. States worse with position changes. States that getting up and walking makes it feel like she is off balance and will pass out.  States that she bent over in a bathroom to pick up something and when she stood up and almost passed out.  Has buzzing in ears and feels heavy in her head. States that she has been drinking plenty of water She still has a menstrual period and it is heavy but last one was 04/27/2021  No history of stroke per patient. Father had CVA in early 5s. Past Medical History:  Diagnosis Date   Bell's palsy complicating pregnancy    L facial weakness. Present for a week   GERD (gastroesophageal reflux disease)    Mental disorder    No pertinent past medical history    Panic attacks    Ulcer     Past Surgical History:  Procedure Laterality Date   CESAREAN SECTION  05/13/2011   Procedure: CESAREAN SECTION;  Surgeon: Robley Fries;  Location: [facility];  Service: Gynecology;  Laterality: N/A;   NO PAST SURGERIES      No family history on file.  Social History   Socioeconomic History   Marital status: Married    Spouse name: Not on file   Number of children: Not on file   Years of education: Not on file   Highest education level: Not on file  Occupational History   Not on file  Tobacco Use   Smoking status: Never   Smokeless tobacco: Never  Vaping Use   Vaping Use: Never used  Substance and  Sexual Activity   Alcohol use: No   Drug use: No   Sexual activity: Yes    Partners: Male    Birth control/protection: Condom  Other Topics Concern   Not on file  Social History Narrative   Regular exercise--yes, 2 times a week running, lifts weight   Diet: Fruit and veggies,water   Social Determinants of Health   Financial Resource Strain: Not on file  Food Insecurity: Not on file  Transportation Needs: Not on file  Physical Activity: Not on file  Stress: Not on file  Social Connections: Not on file  Intimate Partner Violence: Not on file    Outpatient Medications Prior to Visit  Medication Sig Dispense Refill   vitamin B-12 (CYANOCOBALAMIN) 1000 MCG tablet Take 1,000 mcg by mouth every other day.     No facility-administered medications prior to visit.    Allergies  Allergen Reactions   Nubain [Nalbuphine Hcl]     Proximal effect   Stadol [Butorphanol Tartrate]     Proximal reaction    Review of Systems  Constitutional:  Negative for chills and fever.  Respiratory:  Negative for cough and shortness of breath.   Cardiovascular:  Negative for chest pain.  Gastrointestinal:  Negative for  abdominal pain, diarrhea, nausea and vomiting.  Genitourinary:  Negative for decreased urine volume, dysuria, hematuria and vaginal bleeding.  Skin:  Negative for color change and pallor.  Neurological:  Positive for light-headedness and numbness (baseline to left arm due to compressed nerve. Not new). Negative for dizziness, facial asymmetry and weakness.      Objective:    Physical Exam Vitals and nursing note reviewed.  Constitutional:      Appearance: Normal appearance.  HENT:     Right Ear: Tympanic membrane, ear canal and external ear normal. There is no impacted cerumen.     Left Ear: Tympanic membrane, ear canal and external ear normal. There is no impacted cerumen.     Mouth/Throat:     Mouth: Mucous membranes are moist.     Pharynx: Oropharynx is clear.  Eyes:      Extraocular Movements: Extraocular movements intact.     Pupils: Pupils are equal, round, and reactive to light.  Neck:     Thyroid: No thyroid mass, thyromegaly or thyroid tenderness.     Vascular: No carotid bruit.  Cardiovascular:     Rate and Rhythm: Normal rate and regular rhythm.  Pulmonary:     Effort: Pulmonary effort is normal.     Breath sounds: Normal breath sounds.  Abdominal:     General: Bowel sounds are normal.  Musculoskeletal:     Right lower leg: No edema.     Left lower leg: No edema.  Lymphadenopathy:     Cervical: No cervical adenopathy.  Skin:    General: Skin is warm.  Neurological:     General: No focal deficit present.     Mental Status: Mental status is at baseline.     Cranial Nerves: Cranial nerves 2-12 are intact.     Sensory: Sensation is intact.     Motor: No weakness, abnormal muscle tone or pronator drift.     Coordination: Coordination normal. Finger-Nose-Finger Test normal.     Gait: Gait is intact.     Deep Tendon Reflexes:     Reflex Scores:      Bicep reflexes are 2+ on the right side and 2+ on the left side.      Patellar reflexes are 2+ on the right side and 2+ on the left side.    Comments: Bilateral upper and lower extremity strength 5/5  Psychiatric:        Mood and Affect: Mood normal.        Behavior: Behavior normal.        Thought Content: Thought content normal.        Judgment: Judgment normal.    BP 94/60   Pulse 80   Temp 97.9 F (36.6 C)   Resp 12   Ht 5' 0.75" (1.543 m)   Wt 119 lb 8 oz (54.2 kg)   LMP 04/24/2021   SpO2 99%   BMI 22.77 kg/m  Wt Readings from Last 3 Encounters:  05/10/21 119 lb 8 oz (54.2 kg)  11/18/20 114 lb (51.7 kg)  02/24/20 111 lb (50.3 kg)    Health Maintenance Due  Topic Date Due   COVID-19 Vaccine (1) Never done   MAMMOGRAM  Never done   PAP SMEAR-Modifier  02/01/2021   INFLUENZA VACCINE  02/07/2021    There are no preventive care reminders to display for this patient.   Lab  Results  Component Value Date   TSH 1.54 11/19/2020   Lab Results  Component Value Date  WBC 5.0 11/19/2020   HGB 13.0 11/19/2020   HCT 38.9 11/19/2020   MCV 78.8 11/19/2020   PLT 275.0 11/19/2020   Lab Results  Component Value Date   NA 138 11/11/2020   K 3.9 11/11/2020   CO2 27 11/11/2020   GLUCOSE 96 11/11/2020   BUN 12 11/11/2020   CREATININE 0.56 11/11/2020   BILITOT 0.5 11/11/2020   ALKPHOS 61 11/11/2020   AST 22 11/11/2020   ALT 18 11/11/2020   PROT 7.8 11/11/2020   ALBUMIN 4.5 11/11/2020   CALCIUM 9.2 11/11/2020   ANIONGAP 9 02/24/2020   GFR 111.90 11/11/2020   Lab Results  Component Value Date   CHOL 212 (H) 11/11/2020   Lab Results  Component Value Date   HDL 63.90 11/11/2020   Lab Results  Component Value Date   LDLCALC 131 (H) 11/11/2020   Lab Results  Component Value Date   TRIG 86.0 11/11/2020   Lab Results  Component Value Date   CHOLHDL 3 11/11/2020   No results found for: HGBA1C     Assessment & Plan:   Problem List Items Addressed This Visit       Cardiovascular and Mediastinum   Orthostatic hypotension    Patient positive orthostatic in office.  She states she drinking plenty of fluid this is also reflected in her urine specific gravity within normal limits actually on the lower end of such.  Does have heavy periods but is been about 2 weeks since her last menstrual cycle.  We will check basic blood work to see if she is hypovolemic in another fashion.  Did suggest that patient drink something with electrolytes inclusive of sodium to hold onto some of the water that she is drinking.  Patient knowledge understood did review signs and symptoms as when to seek emergent and urgent health care.  EKG reviewed and within normal limits in comparison to last 1 in office.      Relevant Orders   CBC   Comprehensive metabolic panel   POCT URINALYSIS DIP (CLINITEK) (Completed)     Genitourinary   Other microscopic hematuria   Relevant  Orders   Urinalysis, microscopic only     Other   High cholesterol    States history of same, work on is concerned that cholesterol has something to do with this.  Patient asked to check lipid panel as she is fasting today.  Will oblige.  Pending lab results      Relevant Orders   Lipid panel   Light headed - Primary    Seems positional in nature.  Patient is orthostatic think she is hypovolemic in some nature.  Gave recommendations for fluid volume and and signs and symptoms reviewed as when to seek urgent or emergent health care.  Pending lab results      Relevant Orders   CBC   Comprehensive metabolic panel   TSH   POCT URINALYSIS DIP (CLINITEK) (Completed)   EKG 12-Lead (Completed)     No orders of the defined types were placed in this encounter.  This visit occurred during the SARS-CoV-2 public health emergency.  Safety protocols were in place, including screening questions prior to the visit, additional usage of staff PPE, and extensive cleaning of exam room while observing appropriate contact time as indicated for disinfecting solutions.   Romilda Garret, NP

## 2021-05-10 NOTE — Assessment & Plan Note (Signed)
Patient positive orthostatic in office.  She states she drinking plenty of fluid this is also reflected in her urine specific gravity within normal limits actually on the lower end of such.  Does have heavy periods but is been about 2 weeks since her last menstrual cycle.  We will check basic blood work to see if she is hypovolemic in another fashion.  Did suggest that patient drink something with electrolytes inclusive of sodium to hold onto some of the water that she is drinking.  Patient knowledge understood did review signs and symptoms as when to seek emergent and urgent health care.  EKG reviewed and within normal limits in comparison to last 1 in office.

## 2021-05-13 ENCOUNTER — Ambulatory Visit: Payer: 59 | Admitting: Family Medicine

## 2022-03-03 ENCOUNTER — Ambulatory Visit: Payer: 59 | Admitting: Family Medicine

## 2022-03-03 ENCOUNTER — Encounter: Payer: Self-pay | Admitting: Family Medicine

## 2022-03-03 VITALS — BP 90/68 | HR 91 | Temp 98.6°F | Ht 60.75 in | Wt 121.4 lb

## 2022-03-03 DIAGNOSIS — M79671 Pain in right foot: Secondary | ICD-10-CM

## 2022-03-03 DIAGNOSIS — E7800 Pure hypercholesterolemia, unspecified: Secondary | ICD-10-CM | POA: Diagnosis not present

## 2022-03-03 DIAGNOSIS — L509 Urticaria, unspecified: Secondary | ICD-10-CM

## 2022-03-03 LAB — LIPID PANEL
Cholesterol: 204 mg/dL — ABNORMAL HIGH (ref 0–200)
HDL: 57.4 mg/dL (ref >39.00)
LDL Cholesterol: 132 mg/dL — ABNORMAL HIGH (ref 0–99)
NonHDL: 146.79
Total CHOL/HDL Ratio: 4
Triglycerides: 76 mg/dL (ref 0.0–149.0)
VLDL: 15.2 mg/dL (ref 0.0–40.0)

## 2022-03-03 LAB — COMPREHENSIVE METABOLIC PANEL
ALT: 14 U/L (ref 0–35)
AST: 17 U/L (ref 0–37)
Albumin: 4.4 g/dL (ref 3.5–5.2)
Alkaline Phosphatase: 53 U/L (ref 39–117)
BUN: 9 mg/dL (ref 6–23)
CO2: 27 mEq/L (ref 19–32)
Calcium: 9 mg/dL (ref 8.4–10.5)
Chloride: 101 mEq/L (ref 96–112)
Creatinine, Ser: 0.6 mg/dL (ref 0.40–1.20)
GFR: 109.05 mL/min (ref >60.00)
Glucose, Bld: 85 mg/dL (ref 70–99)
Potassium: 4.2 mEq/L (ref 3.5–5.1)
Sodium: 138 mEq/L (ref 135–145)
Total Bilirubin: 0.5 mg/dL (ref 0.2–1.2)
Total Protein: 7.2 g/dL (ref 6.0–8.3)

## 2022-03-03 LAB — CBC WITH DIFFERENTIAL/PLATELET
Basophils Absolute: 0.1 10*3/uL (ref 0.0–0.1)
Basophils Relative: 0.9 % (ref 0.0–3.0)
Eosinophils Absolute: 0.1 10*3/uL (ref 0.0–0.7)
Eosinophils Relative: 2.2 % (ref 0.0–5.0)
HCT: 38.4 % (ref 36.0–46.0)
Hemoglobin: 12.6 g/dL (ref 12.0–15.0)
Lymphocytes Relative: 28.3 % (ref 12.0–46.0)
Lymphs Abs: 1.8 10*3/uL (ref 0.7–4.0)
MCHC: 32.8 g/dL (ref 30.0–36.0)
MCV: 79.8 fl (ref 78.0–100.0)
Monocytes Absolute: 0.5 10*3/uL (ref 0.1–1.0)
Monocytes Relative: 7.4 % (ref 3.0–12.0)
Neutro Abs: 3.9 10*3/uL (ref 1.4–7.7)
Neutrophils Relative %: 61.2 % (ref 43.0–77.0)
Platelets: 329 10*3/uL (ref 150.0–400.0)
RBC: 4.82 Mil/uL (ref 3.87–5.11)
RDW: 13.2 % (ref 11.5–15.5)
WBC: 6.4 10*3/uL (ref 4.0–10.5)

## 2022-03-03 LAB — TSH: TSH: 1.02 u[IU]/mL (ref 0.35–5.50)

## 2022-03-03 NOTE — Patient Instructions (Addendum)
You can consider trial of Claritin during the day or Zyrtec at night.  Please stop at the lab to have labs drawn.  We will start a referral to an allergist for testing.

## 2022-03-03 NOTE — Assessment & Plan Note (Signed)
Acute, recurrent Unclear trigger but seems to happen only at home.  They do have a new dog in the last 2 months. I encouraged her to try a daily nondrowsy antihistamine but she is hesitant to use the medication.  She is most interested in determining what her allergy is to. We will move forward with an allergy referral for allergy testing.  No signs and symptoms of anaphylaxis, reviewed ER precautions.

## 2022-03-03 NOTE — Assessment & Plan Note (Signed)
She mentioned this as I was walking out the door, I suggested she stop wearing flip-flops and use shoes with cushion.  She can use Voltaren gel as needed.  If its not improving she will make an appointment for evaluation.

## 2022-03-03 NOTE — Progress Notes (Signed)
Patient ID: Felicia Mccoy, female    DOB: 03-01-1976, 46 y.o.   MRN: [id number]  This visit was conducted in person.  BP 90/68   Pulse 91   Temp 98.6 F (37 C) (Oral)   Ht 5' 0.75" (1.543 m)   Wt 121 lb 6 oz (55.1 kg)   LMP 03/02/2022   SpO2 99%   BMI 23.12 kg/m    CC:  Chief Complaint  Patient presents with   Urticaria    Comes and Goes-Mostly comes in the afternoon    Subjective:   HPI: Felicia Mccoy is a 46 y.o. female presenting on 03/03/2022 for Urticaria (Comes and Felicia Mccoy comes in the afternoon)   She has noted new onset intermittent rash since 01/10/22.  Spent the day outside.  She noted itchy, red raised rash, on thighs, neck, back and chest.   Resolved after showering in cold water, improved gradually. Since then she has intermittent episodes of rash appera same time each day.   No new exposures. She has been thinking it may be her foods... rice, fish and veggies.   No new exposures, foods, meds, supplements.   She noted less issue when on vacation. No issues at work.  2 month ago she got a new hypoallergenic dog, inside.  No runny nose, no cough, no SOB, no wheeze.  No oral swelling     Relevant past medical, surgical, family and social history reviewed and updated as indicated. Interim medical history since our last visit reviewed. Allergies and medications reviewed and updated. Outpatient Medications Prior to Visit  Medication Sig Dispense Refill   vitamin B-12 (CYANOCOBALAMIN) 1000 MCG tablet Take 1,000 mcg by mouth every other day.     No facility-administered medications prior to visit.     Per HPI unless specifically indicated in ROS section below Review of Systems  Constitutional:  Negative for fatigue and fever.  HENT:  Negative for congestion and ear pain.   Eyes:  Negative for pain.  Respiratory:  Negative for cough, chest tightness and shortness of breath.   Cardiovascular:  Negative for chest pain, palpitations and leg  swelling.  Gastrointestinal:  Negative for abdominal pain.  Genitourinary:  Negative for dysuria and vaginal bleeding.  Musculoskeletal:  Negative for back pain.       Right lateral heel pain: She mentioned this as I was walking out the door, I suggested she stop wearing flip-flops and use shoes with cushion.  She can use Voltaren gel as needed.  If its not improving she will make an appointment for evaluation.  Neurological:  Negative for syncope, light-headedness and headaches.  Psychiatric/Behavioral:  Negative for dysphoric mood.    Objective:  BP 90/68   Pulse 91   Temp 98.6 F (37 C) (Oral)   Ht 5' 0.75" (1.543 m)   Wt 121 lb 6 oz (55.1 kg)   LMP 03/02/2022   SpO2 99%   BMI 23.12 kg/m   Wt Readings from Last 3 Encounters:  03/03/22 121 lb 6 oz (55.1 kg)  05/10/21 119 lb 8 oz (54.2 kg)  11/18/20 114 lb (51.7 kg)      Physical Exam Constitutional:      General: She is not in acute distress.    Appearance: Normal appearance. She is well-developed. She is not ill-appearing or toxic-appearing.  HENT:     Head: Normocephalic.     Right Ear: Hearing, tympanic membrane, ear canal and external ear normal. Tympanic membrane is not erythematous, retracted or  bulging.     Left Ear: Hearing, tympanic membrane, ear canal and external ear normal. Tympanic membrane is not erythematous, retracted or bulging.     Nose: No mucosal edema or rhinorrhea.     Right Sinus: No maxillary sinus tenderness or frontal sinus tenderness.     Left Sinus: No maxillary sinus tenderness or frontal sinus tenderness.     Mouth/Throat:     Pharynx: Uvula midline.  Eyes:     General: Lids are normal. Lids are everted, no foreign bodies appreciated.     Conjunctiva/sclera: Conjunctivae normal.     Pupils: Pupils are equal, round, and reactive to light.  Neck:     Thyroid: No thyroid mass or thyromegaly.     Vascular: No carotid bruit.     Trachea: Trachea normal.  Cardiovascular:     Rate and Rhythm:  Normal rate and regular rhythm.     Pulses: Normal pulses.     Heart sounds: Normal heart sounds, S1 normal and S2 normal. No murmur heard.    No friction rub. No gallop.  Pulmonary:     Effort: Pulmonary effort is normal. No tachypnea or respiratory distress.     Breath sounds: Normal breath sounds. No decreased breath sounds, wheezing, rhonchi or rales.  Abdominal:     General: Bowel sounds are normal.     Palpations: Abdomen is soft.     Tenderness: There is no abdominal tenderness.  Musculoskeletal:     Cervical back: Normal range of motion and neck supple.  Skin:    General: Skin is warm and dry.     Findings: Rash present.     Comments: Current rash in smal lesion, darkened on right upper inner thigh. Additional picture is of her rash from 01/10/22 from her phone.  Neurological:     Mental Status: She is alert.  Psychiatric:        Mood and Affect: Mood is not anxious or depressed.        Speech: Speech normal.        Behavior: Behavior normal. Behavior is cooperative.        Thought Content: Thought content normal.        Judgment: Judgment normal.          Results for orders placed or performed in visit on 05/10/21  CBC  Result Value Ref Range   WBC 6.5 4.0 - 10.5 K/uL   RBC 5.18 (H) 3.87 - 5.11 Mil/uL   Platelets 316.0 150.0 - 400.0 K/uL   Hemoglobin 13.4 12.0 - 15.0 g/dL   HCT 15.0 56.9 - 79.4 %   MCV 78.1 78.0 - 100.0 fl   MCHC 33.1 30.0 - 36.0 g/dL   RDW 80.1 65.5 - 37.4 %  Comprehensive metabolic panel  Result Value Ref Range   Sodium 137 135 - 145 mEq/L   Potassium 4.0 3.5 - 5.1 mEq/L   Chloride 103 96 - 112 mEq/L   CO2 27 19 - 32 mEq/L   Glucose, Bld 95 70 - 99 mg/dL   BUN 9 6 - 23 mg/dL   Creatinine, Ser 8.27 0.40 - 1.20 mg/dL   Total Bilirubin 0.6 0.2 - 1.2 mg/dL   Alkaline Phosphatase 51 39 - 117 U/L   AST 19 0 - 37 U/L   ALT 15 0 - 35 U/L   Total Protein 7.6 6.0 - 8.3 g/dL   Albumin 4.6 3.5 - 5.2 g/dL   GFR 078.67 >54.49 mL/min   Calcium 9.3  8.4 - 10.5 mg/dL  TSH  Result Value Ref Range   TSH 1.41 0.35 - 5.50 uIU/mL  Lipid panel  Result Value Ref Range   Cholesterol 219 (H) 0 - 200 mg/dL   Triglycerides 44.0 0.0 - 149.0 mg/dL   HDL 10.27 >25.36 mg/dL   VLDL 64.4 0.0 - 03.4 mg/dL   LDL Cholesterol 742 (H) 0 - 99 mg/dL   Total CHOL/HDL Ratio 4    NonHDL 158.50   Urinalysis, microscopic only  Result Value Ref Range   WBC, UA none seen 0-2/hpf   RBC / HPF none seen 0-2/hpf   Squamous Epithelial / LPF Rare(0-4/hpf) Rare(0-4/hpf)  POCT URINALYSIS DIP (CLINITEK)  Result Value Ref Range   Color, UA light yellow (A) yellow   Clarity, UA clear clear   Glucose, UA negative negative mg/dL   Bilirubin, UA negative negative   Ketones, POC UA negative negative mg/dL   Spec Grav, UA <=5.956 (A) 1.010 - 1.025   Blood, UA trace-lysed (A) negative   pH, UA 6.5 5.0 - 8.0   POC PROTEIN,UA negative negative, trace   Urobilinogen, UA 0.2 0.2 or 1.0 E.U./dL   Nitrite, UA Negative Negative   Leukocytes, UA Negative Negative     COVID 19 screen:  No recent travel or known exposure to COVID19 The patient denies respiratory symptoms of COVID 19 at this time. The importance of social distancing was discussed today.   Assessment and Plan    Problem List Items Addressed This Visit     High cholesterol    She has been working on low-cholesterol diet and would like to recheck her cholesterol today.      Relevant Orders   Lipid panel   Hives - Primary    Acute, recurrent Unclear trigger but seems to happen only at home.  They do have a new dog in the last 2 months. I encouraged her to try a daily nondrowsy antihistamine but she is hesitant to use the medication.  She is most interested in determining what her allergy is to. We will move forward with an allergy referral for allergy testing.  No signs and symptoms of anaphylaxis, reviewed ER precautions.      Relevant Orders   Ambulatory referral to Allergy   CBC with  Differential/Platelet   Comprehensive metabolic panel   TSH   Inflammatory heel pain, right    She mentioned this as I was walking out the door, I suggested she stop wearing flip-flops and use shoes with cushion.  She can use Voltaren gel as needed.  If its not improving she will make an appointment for evaluation.      Orders Placed This Encounter  Procedures   CBC with Differential/Platelet   Lipid panel   Comprehensive metabolic panel   TSH   Ambulatory referral to Allergy     Kerby Nora, MD

## 2022-03-03 NOTE — Assessment & Plan Note (Signed)
She has been working on low-cholesterol diet and would like to recheck her cholesterol today.

## 2023-01-25 ENCOUNTER — Ambulatory Visit: Payer: 59 | Admitting: Orthopedic Surgery

## 2023-02-08 ENCOUNTER — Ambulatory Visit: Payer: 59 | Admitting: Orthopedic Surgery

## 2023-08-16 ENCOUNTER — Ambulatory Visit: Payer: Self-pay | Admitting: Family Medicine

## 2023-08-16 NOTE — Telephone Encounter (Signed)
 Noted.

## 2023-08-16 NOTE — Telephone Encounter (Signed)
 Copied from CRM [id number]. Topic: Clinical - Red Word Triage >> Aug 16, 2023  8:54 AM Franky GRADE wrote: Red Word that prompted transfer to Nurse Triage: Patient is calling with symptoms of dizziness for the last 3 days. She is not sure what's causing it but feels like she is going to pass out.     Chief Complaint: Dizziness Symptoms: dizzy, lightheaded Pertinent Negatives: Patient denies difficulty breathing, chest pain Disposition: [] ED /[] Urgent Care (no appt availability in office) / [x] Appointment(In office/virtual)/ []  [facility]/ [] Home Care/ [] Refused Recommended Disposition /[] [facility]/ []  Follow-up with PCP Additional Notes: Patient stated she has been having intermittent dizziness/lightheadedness for the past 2 days. She stated the feeling is mild right now. She does not feel like passing out. She is still able to walk okay. She denies any other symptoms at this time. Appointment scheduled for tomorrow morning. Patient would like to have blood work done.    Reason for Disposition  [1] MILD dizziness (e.g., walking normally) AND [2] has NOT been evaluated by doctor (or NP/PA) for this  (Exception: Dizziness caused by heat exposure, sudden standing, or poor fluid intake.)  Answer Assessment - Initial Assessment Questions 1. DESCRIPTION: Describe your dizziness.     Dizziness comes and goes, mild dizziness at this time  2. LIGHTHEADED: Do you feel lightheaded? (e.g., somewhat faint, woozy, weak upon standing)     Yes  3. VERTIGO: Do you feel like either you or the room is spinning or tilting? (i.e. vertigo)     No  4. SEVERITY: How bad is it?  Do you feel like you are going to faint? Can you stand and walk?   - MILD: Feels slightly dizzy, but walking normally.   - MODERATE: Feels unsteady when walking, but not falling; interferes with normal activities (e.g., school, work).   - SEVERE: Unable to walk without falling, or requires assistance to  walk without falling; feels like passing out now.      Still able to walk   5. ONSET:  When did the dizziness begin?     2 days ago   6. AGGRAVATING FACTORS: Does anything make it worse? (e.g., standing, change in head position)     Change in head position  7. CAUSE: What do you think is causing the dizziness?     Unknown  8. RECURRENT SYMPTOM: Have you had dizziness before? If Yes, ask: When was the last time? What happened that time?     No  9. OTHER SYMPTOMS: Do you have any other symptoms? (e.g., fever, chest pain, vomiting, diarrhea, bleeding)       No  Protocols used: Dizziness - Lightheadedness-A-AH

## 2023-08-17 ENCOUNTER — Encounter: Payer: Self-pay | Admitting: Family Medicine

## 2023-08-17 ENCOUNTER — Ambulatory Visit: Payer: 59 | Admitting: Family Medicine

## 2023-08-17 VITALS — BP 100/70 | HR 80 | Temp 98.1°F | Ht 60.75 in | Wt 128.2 lb

## 2023-08-17 DIAGNOSIS — H9313 Tinnitus, bilateral: Secondary | ICD-10-CM | POA: Diagnosis not present

## 2023-08-17 DIAGNOSIS — R42 Dizziness and giddiness: Secondary | ICD-10-CM | POA: Diagnosis not present

## 2023-08-17 DIAGNOSIS — R002 Palpitations: Secondary | ICD-10-CM | POA: Insufficient documentation

## 2023-08-17 DIAGNOSIS — R5383 Other fatigue: Secondary | ICD-10-CM

## 2023-08-17 LAB — COMPREHENSIVE METABOLIC PANEL
ALT: 16 U/L (ref 0–35)
AST: 18 U/L (ref 0–37)
Albumin: 4.5 g/dL (ref 3.5–5.2)
Alkaline Phosphatase: 61 U/L (ref 39–117)
BUN: 9 mg/dL (ref 6–23)
CO2: 28 meq/L (ref 19–32)
Calcium: 9.3 mg/dL (ref 8.4–10.5)
Chloride: 102 meq/L (ref 96–112)
Creatinine, Ser: 0.52 mg/dL (ref 0.40–1.20)
GFR: 111.73 mL/min (ref >60.00)
Glucose, Bld: 88 mg/dL (ref 70–99)
Potassium: 4.4 meq/L (ref 3.5–5.1)
Sodium: 138 meq/L (ref 135–145)
Total Bilirubin: 0.3 mg/dL (ref 0.2–1.2)
Total Protein: 7.3 g/dL (ref 6.0–8.3)

## 2023-08-17 LAB — CBC WITH DIFFERENTIAL/PLATELET
Basophils Absolute: 0.1 10*3/uL (ref 0.0–0.1)
Basophils Relative: 0.7 % (ref 0.0–3.0)
Eosinophils Absolute: 0.2 10*3/uL (ref 0.0–0.7)
Eosinophils Relative: 3.1 % (ref 0.0–5.0)
HCT: 40.7 % (ref 36.0–46.0)
Hemoglobin: 13.2 g/dL (ref 12.0–15.0)
Lymphocytes Relative: 29.1 % (ref 12.0–46.0)
Lymphs Abs: 2 10*3/uL (ref 0.7–4.0)
MCHC: 32.4 g/dL (ref 30.0–36.0)
MCV: 80.5 fL (ref 78.0–100.0)
Monocytes Absolute: 0.5 10*3/uL (ref 0.1–1.0)
Monocytes Relative: 7.6 % (ref 3.0–12.0)
Neutro Abs: 4.1 10*3/uL (ref 1.4–7.7)
Neutrophils Relative %: 59.5 % (ref 43.0–77.0)
Platelets: 390 10*3/uL (ref 150.0–400.0)
RBC: 5.05 Mil/uL (ref 3.87–5.11)
RDW: 13.4 % (ref 11.5–15.5)
WBC: 6.9 10*3/uL (ref 4.0–10.5)

## 2023-08-17 LAB — T3, FREE: T3, Free: 3.1 pg/mL (ref 2.3–4.2)

## 2023-08-17 LAB — VITAMIN B12: Vitamin B-12: 778 pg/mL (ref 211–911)

## 2023-08-17 LAB — TSH: TSH: 1.58 u[IU]/mL (ref 0.35–5.50)

## 2023-08-17 LAB — T4, FREE: Free T4: 0.79 ng/dL (ref 0.60–1.60)

## 2023-08-17 LAB — MAGNESIUM: Magnesium: 2.1 mg/dL (ref 1.5–2.5)

## 2023-08-17 NOTE — Assessment & Plan Note (Signed)
 EKG: normal EKG, normal sinus rhythm, unchanged from previous tracings.  Will evaluate with lab work.  If no clear cause consider heart monitor.  No suggestion of dehydration.  Return and ER precautions provided.

## 2023-08-17 NOTE — Progress Notes (Signed)
 Patient ID: Felicia Mccoy, female    DOB: 11-24-1975, 48 y.o.   MRN: [id number]  This visit was conducted in person.  BP 100/70 (BP Location: Left Arm, Patient Position: Sitting, Cuff Size: Normal)   Pulse 80   Temp 98.1 F (36.7 C) (Temporal)   Ht 5' 0.75 (1.543 m)   Wt 128 lb 4 oz (58.2 kg)   LMP 08/04/2023   SpO2 99%   BMI 24.43 kg/m    CC:  Chief Complaint  Patient presents with   Dizziness    Started on Tuesday   Fatigue    Subjective:   HPI: Felicia Mccoy is a 48 y.o. female presenting on 08/17/2023 for Dizziness (Started on Tuesday) and Fatigue   2 days of intermittent dizziness/lightheadedness.  She describes as financial controller moving head... she feels like she is going to move forward.  She feels like she is floating.  She has been more fatigued than usual... ongoing for months.  Occ noting palpitations at night x 3 days... last few moments and goes away when sleeping  No HA, no weakness. Occ numbness in arms when waking at night. Feeling more cold than usual.  No chest pain, no SOB.     No new medications. Taking Mag, vit C and B12.  Drinking  almost 64 oz water a day.   Has some history of tinnitus.  No hearing loss.  She has heavy menses in last several months... change a pad 4 tmes a day. Relevant past medical, surgical, family and social history reviewed and updated as indicated. Interim medical history since our last visit reviewed. Allergies and medications reviewed and updated. Outpatient Medications Prior to Visit  Medication Sig Dispense Refill   Magnesium 250 MG TABS Take 1 tablet by mouth every other day.     vitamin B-12 (CYANOCOBALAMIN ) 1000 MCG tablet Take 1,000 mcg by mouth every other day.     No facility-administered medications prior to visit.     Per HPI unless specifically indicated in ROS section below Review of Systems  Constitutional:  Positive for fatigue. Negative for fever.  HENT:  Negative for congestion.   Eyes:   Negative for pain.  Respiratory:  Negative for cough and shortness of breath.   Cardiovascular:  Negative for chest pain, palpitations and leg swelling.  Gastrointestinal:  Negative for abdominal pain.  Genitourinary:  Negative for dysuria and vaginal bleeding.  Musculoskeletal:  Negative for back pain.  Neurological:  Positive for light-headedness. Negative for syncope and headaches.  Psychiatric/Behavioral:  Negative for dysphoric mood.    Objective:  BP 100/70 (BP Location: Left Arm, Patient Position: Sitting, Cuff Size: Normal)   Pulse 80   Temp 98.1 F (36.7 C) (Temporal)   Ht 5' 0.75 (1.543 m)   Wt 128 lb 4 oz (58.2 kg)   LMP 08/04/2023   SpO2 99%   BMI 24.43 kg/m   Wt Readings from Last 3 Encounters:  08/17/23 128 lb 4 oz (58.2 kg)  03/03/22 121 lb 6 oz (55.1 kg)  05/10/21 119 lb 8 oz (54.2 kg)      Physical Exam Constitutional:      General: She is not in acute distress.    Appearance: Normal appearance. She is well-developed. She is not ill-appearing or toxic-appearing.  HENT:     Head: Normocephalic.     Right Ear: Hearing, tympanic membrane, ear canal and external ear normal. Tympanic membrane is not erythematous, retracted or bulging.     Left Ear:  Hearing, tympanic membrane, ear canal and external ear normal. Tympanic membrane is not erythematous, retracted or bulging.     Nose: No mucosal edema or rhinorrhea.     Right Sinus: No maxillary sinus tenderness or frontal sinus tenderness.     Left Sinus: No maxillary sinus tenderness or frontal sinus tenderness.     Mouth/Throat:     Pharynx: Uvula midline.  Eyes:     General: Lids are normal. Lids are everted, no foreign bodies appreciated.     Conjunctiva/sclera: Conjunctivae normal.     Pupils: Pupils are equal, round, and reactive to light.  Neck:     Thyroid : No thyroid  mass or thyromegaly.     Vascular: No carotid bruit.     Trachea: Trachea normal.  Cardiovascular:     Rate and Rhythm: Normal rate and  regular rhythm.     Pulses: Normal pulses.     Heart sounds: Normal heart sounds, S1 normal and S2 normal. No murmur heard.    No friction rub. No gallop.  Pulmonary:     Effort: Pulmonary effort is normal. No tachypnea or respiratory distress.     Breath sounds: Normal breath sounds. No decreased breath sounds, wheezing, rhonchi or rales.  Abdominal:     General: Bowel sounds are normal.     Palpations: Abdomen is soft.     Tenderness: There is no abdominal tenderness.  Musculoskeletal:     Cervical back: Normal range of motion and neck supple.  Skin:    General: Skin is warm and dry.     Findings: No rash.  Neurological:     Mental Status: She is alert and oriented to person, place, and time.     GCS: GCS eye subscore is 4. GCS verbal subscore is 5. GCS motor subscore is 6.     Cranial Nerves: No cranial nerve deficit.     Sensory: No sensory deficit.     Motor: No abnormal muscle tone.     Coordination: Coordination normal.     Gait: Gait normal.     Deep Tendon Reflexes: Reflexes are normal and symmetric.     Comments: Nml cerebellar exam   No papilledema  Psychiatric:        Mood and Affect: Mood is not anxious or depressed.        Speech: Speech normal.        Behavior: Behavior normal. Behavior is cooperative.        Thought Content: Thought content normal.        Cognition and Memory: Memory is not impaired. She does not exhibit impaired recent memory or impaired remote memory.        Judgment: Judgment normal.       Results for orders placed or performed in visit on 03/03/22  CBC with Differential/Platelet   Collection Time: 03/03/22 11:04 AM  Result Value Ref Range   WBC 6.4 4.0 - 10.5 K/uL   RBC 4.82 3.87 - 5.11 Mil/uL   Hemoglobin 12.6 12.0 - 15.0 g/dL   HCT 61.5 63.9 - 53.9 %   MCV 79.8 78.0 - 100.0 fl   MCHC 32.8 30.0 - 36.0 g/dL   RDW 86.7 88.4 - 84.4 %   Platelets 329.0 150.0 - 400.0 K/uL   Neutrophils Relative % 61.2 43.0 - 77.0 %   Lymphocytes  Relative 28.3 12.0 - 46.0 %   Monocytes Relative 7.4 3.0 - 12.0 %   Eosinophils Relative 2.2 0.0 - 5.0 %  Basophils Relative 0.9 0.0 - 3.0 %   Neutro Abs 3.9 1.4 - 7.7 K/uL   Lymphs Abs 1.8 0.7 - 4.0 K/uL   Monocytes Absolute 0.5 0.1 - 1.0 K/uL   Eosinophils Absolute 0.1 0.0 - 0.7 K/uL   Basophils Absolute 0.1 0.0 - 0.1 K/uL  Lipid panel   Collection Time: 03/03/22 11:04 AM  Result Value Ref Range   Cholesterol 204 (H) 0 - 200 mg/dL   Triglycerides 23.9 0.0 - 149.0 mg/dL   HDL 42.59 >60.99 mg/dL   VLDL 84.7 0.0 - 59.9 mg/dL   LDL Cholesterol 867 (H) 0 - 99 mg/dL   Total CHOL/HDL Ratio 4    NonHDL 146.79   Comprehensive metabolic panel   Collection Time: 03/03/22 11:04 AM  Result Value Ref Range   Sodium 138 135 - 145 mEq/L   Potassium 4.2 3.5 - 5.1 mEq/L   Chloride 101 96 - 112 mEq/L   CO2 27 19 - 32 mEq/L   Glucose, Bld 85 70 - 99 mg/dL   BUN 9 6 - 23 mg/dL   Creatinine, Ser 9.39 0.40 - 1.20 mg/dL   Total Bilirubin 0.5 0.2 - 1.2 mg/dL   Alkaline Phosphatase 53 39 - 117 U/L   AST 17 0 - 37 U/L   ALT 14 0 - 35 U/L   Total Protein 7.2 6.0 - 8.3 g/dL   Albumin 4.4 3.5 - 5.2 g/dL   GFR 890.94 >39.99 mL/min   Calcium  9.0 8.4 - 10.5 mg/dL  TSH   Collection Time: 03/03/22 11:04 AM  Result Value Ref Range   TSH 1.02 0.35 - 5.50 uIU/mL   Hearing Screening  Method: Audiometry   500Hz  1000Hz  2000Hz  4000Hz   Right ear 20 20 20 20   Left ear 20 20 20 20      Assessment and Plan  Vertigo Assessment & Plan: Acute, orthostatics normal, no evidence of dehydration. Symptoms may be secondary to inner ear but given additional symptoms will do lab evaluation.  She does note some tinnitus and this has not been evaluated in the past.  She has no evidence of hearing loss  Orders: -     Vitamin B12 -     CBC with Differential/Platelet -     TSH -     T4, free -     T3, free -     Comprehensive metabolic panel  Palpitations Assessment & Plan: EKG: normal EKG, normal sinus  rhythm, unchanged from previous tracings.  Will evaluate with lab work.  If no clear cause consider heart monitor.  No suggestion of dehydration.  Return and ER precautions provided.   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 Consider ENT referral if not improving.     No follow-ups on file.   Greig Ring, MD

## 2023-08-17 NOTE — Assessment & Plan Note (Signed)
 Acute, orthostatics normal, no evidence of dehydration. Symptoms may be secondary to inner ear but given additional symptoms will do lab evaluation.  She does note some tinnitus and this has not been evaluated in the past.  She has no evidence of hearing loss

## 2023-08-17 NOTE — Assessment & Plan Note (Signed)
 Chronic, ongoing for 2 years, normal ear exam, normal hearing test. Consider ENT referral if not improving.

## 2023-08-20 ENCOUNTER — Telehealth: Payer: Self-pay | Admitting: Family Medicine

## 2023-08-20 NOTE — Telephone Encounter (Signed)
 Spoke with pt. She is wanting to know why she can't see her cholesterol results. Advised her that a lipid panel wasn't drawn. Pt would like for this to be done.  Dr. Cherlyn Cornet - is it okay to place an order for this?

## 2023-08-20 NOTE — Telephone Encounter (Signed)
 Copied from CRM [id number]. Topic: Clinical - Lab/Test Results >> Aug 20, 2023 10:54 AM Albertha Alosa wrote: Reason for CRM: Patient called in stating she is unable to see her lipid panel results, wants them sent to her via mychart

## 2023-08-21 NOTE — Telephone Encounter (Signed)
Okay to have patient come in for lipid panel

## 2023-08-22 ENCOUNTER — Other Ambulatory Visit: Payer: Self-pay | Admitting: Family Medicine

## 2023-08-22 DIAGNOSIS — E7800 Pure hypercholesterolemia, unspecified: Secondary | ICD-10-CM

## 2023-08-22 NOTE — Telephone Encounter (Signed)
Please call and schedule fasting lab appointment for Lipid Panel.  Future orders in Epic. ad

## 2023-08-22 NOTE — Telephone Encounter (Signed)
Spoke to pt, e2c2 already scheduled labs for tomorrow, 2/13

## 2023-08-23 ENCOUNTER — Encounter: Payer: Self-pay | Admitting: Family Medicine

## 2023-08-23 ENCOUNTER — Other Ambulatory Visit (INDEPENDENT_AMBULATORY_CARE_PROVIDER_SITE_OTHER): Payer: 59

## 2023-08-23 DIAGNOSIS — E7800 Pure hypercholesterolemia, unspecified: Secondary | ICD-10-CM | POA: Diagnosis not present

## 2023-08-23 LAB — LIPID PANEL
Cholesterol: 188 mg/dL (ref 0–200)
HDL: 51.3 mg/dL (ref >39.00)
LDL Cholesterol: 121 mg/dL — ABNORMAL HIGH (ref 0–99)
NonHDL: 136.66
Total CHOL/HDL Ratio: 4
Triglycerides: 76 mg/dL (ref 0.0–149.0)
VLDL: 15.2 mg/dL (ref 0.0–40.0)

## 2023-08-31 ENCOUNTER — Ambulatory Visit: Payer: Self-pay | Admitting: Family Medicine

## 2023-08-31 DIAGNOSIS — R42 Dizziness and giddiness: Secondary | ICD-10-CM

## 2023-08-31 NOTE — Telephone Encounter (Signed)
 Copied from CRM [id number]. Topic: Clinical - Red Word Triage >> Aug 31, 2023 10:42 AM Mackie Pai E wrote: [facility] that prompted transfer to Nurse Triage: Patient has been having worsening symptoms over the past 3 days with experiencing dizziness, loss of balance, and states she feels like she could "fall over" at any time. Her right ear has been constantly buzzing and she described it as feeling like she's "underwater."  Chief Complaint: right ear feels congested Symptoms: congestion, under water feeling, loss of balance, buzzing in the ear Frequency: constant Pertinent Negatives: Patient denies fever, runny nose,  Disposition: [] ED /[] Urgent Care (no appt availability in office) / [] Appointment(In office/virtual)/ []  [facility]/ [x] Home Care/ [] Refused Recommended Disposition /[] [facility]/ []  Follow-up with PCP Additional Notes: patient declined apt with pcp office; states she was already there for the same thing.  States wants a referral to ENT specialist.  Would like a call back from the office. PCP office updated. Instructed to go to ER if becomes worse.   Reason for Disposition  Ear congestion present > 48 hours  Answer Assessment - Initial Assessment Questions 1. LOCATION: "Which ear is involved?"       Right ear, congestion, buzzing feels like she is underwater.  2. SENSATION: "Describe how the ear feels." (e.g. stuffy, full, plugged)."      congested 3. ONSET:  "When did the ear symptoms start?"       Two weeks ago 4. PAIN: "Do you also have an earache?" If Yes, ask: "How bad is it?" (Scale 1-10; or mild, moderate, severe)     denies 5. CAUSE: "What do you think is causing the ear congestion?"     Unknown but does have dizziness, loss of balance 6. URI: "Do you have a runny nose or cough?"      denies 7. NASAL ALLERGIES: "Are there symptoms of hay fever, such as sneezing or a clear nasal discharge?"     Denies.  8. PREGNANCY: "Is there any chance you are  pregnant?" "When was your last menstrual period?"     na  Protocols used: Ear - Congestion-A-AH

## 2023-08-31 NOTE — Telephone Encounter (Signed)
 Starling notified as instructed by telephone.  She declines Rx for prednisone at this time.

## 2023-08-31 NOTE — Addendum Note (Signed)
 Addended by: Kerby Nora E on: 08/31/2023 03:46 PM   Modules accepted: Orders

## 2023-09-18 ENCOUNTER — Encounter: Payer: Self-pay | Admitting: *Deleted

## 2023-10-23 ENCOUNTER — Other Ambulatory Visit: Payer: Self-pay | Admitting: Student

## 2023-10-23 DIAGNOSIS — H9311 Tinnitus, right ear: Secondary | ICD-10-CM

## 2023-10-23 DIAGNOSIS — R42 Dizziness and giddiness: Secondary | ICD-10-CM

## 2024-06-20 ENCOUNTER — Encounter: Payer: Self-pay | Admitting: Family Medicine
